# Patient Record
Sex: Male | Born: 1990 | Race: Black or African American | Hispanic: No | Marital: Single | Smoking: Never smoker
Health system: Southern US, Community
[De-identification: ages and names within clinical notes are randomized; demographics above are authoritative.]

## PROBLEM LIST (undated history)

## (undated) DIAGNOSIS — T7840XA Allergy, unspecified, initial encounter: Secondary | ICD-10-CM

## (undated) DIAGNOSIS — A630 Anogenital (venereal) warts: Secondary | ICD-10-CM

## (undated) DIAGNOSIS — K626 Ulcer of anus and rectum: Secondary | ICD-10-CM

## (undated) DIAGNOSIS — Z21 Asymptomatic human immunodeficiency virus [HIV] infection status: Secondary | ICD-10-CM

## (undated) DIAGNOSIS — E7800 Pure hypercholesterolemia, unspecified: Secondary | ICD-10-CM

## (undated) DIAGNOSIS — B20 Human immunodeficiency virus [HIV] disease: Secondary | ICD-10-CM

## (undated) HISTORY — DX: Human immunodeficiency virus (HIV) disease: B20

## (undated) HISTORY — DX: Ulcer of anus and rectum: K62.6

## (undated) HISTORY — DX: Allergy, unspecified, initial encounter: T78.40XA

## (undated) HISTORY — DX: Pure hypercholesterolemia, unspecified: E78.00

## (undated) HISTORY — DX: Anogenital (venereal) warts: A63.0

## (undated) HISTORY — DX: Asymptomatic human immunodeficiency virus (hiv) infection status: Z21

---
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
## 2010-09-24 ENCOUNTER — Emergency Department (HOSPITAL_COMMUNITY)
Admission: EM | Admit: 2010-09-24 | Discharge: 2010-09-24 | Payer: Self-pay | Source: Home / Self Care | Admitting: Family Medicine

## 2010-11-23 LAB — URINALYSIS, ROUTINE W REFLEX MICROSCOPIC
Glucose, UA: NEGATIVE mg/dL
Ketones, ur: 15 mg/dL — AB
Leukocytes, UA: NEGATIVE
Nitrite: NEGATIVE
Protein, ur: 30 mg/dL — AB
Specific Gravity, Urine: 1.035 — ABNORMAL HIGH (ref 1.005–1.030)
Urobilinogen, UA: 1 mg/dL (ref 0.0–1.0)
pH: 6 (ref 5.0–8.0)

## 2010-11-23 LAB — URINE MICROSCOPIC-ADD ON

## 2010-11-23 LAB — RAPID STREP SCREEN (MED CTR MEBANE ONLY): Streptococcus, Group A Screen (Direct): NEGATIVE

## 2010-12-01 ENCOUNTER — Ambulatory Visit (INDEPENDENT_AMBULATORY_CARE_PROVIDER_SITE_OTHER): Payer: Self-pay

## 2010-12-01 DIAGNOSIS — B20 Human immunodeficiency virus [HIV] disease: Secondary | ICD-10-CM

## 2010-12-01 DIAGNOSIS — Z21 Asymptomatic human immunodeficiency virus [HIV] infection status: Secondary | ICD-10-CM

## 2010-12-01 MED ORDER — PNEUMOCOCCAL VAC POLYVALENT 25 MCG/0.5ML IJ INJ
0.5000 mL | INJECTION | Freq: Once | INTRAMUSCULAR | Status: AC
  Administered 2010-12-01: 0.5 mL via INTRAMUSCULAR

## 2010-12-02 LAB — COMPLETE METABOLIC PANEL WITH GFR
AST: 17 U/L (ref 0–37)
Alkaline Phosphatase: 41 U/L (ref 39–117)
BUN: 13 mg/dL (ref 6–23)
Creat: 0.92 mg/dL (ref 0.40–1.50)

## 2010-12-02 LAB — CBC WITH DIFFERENTIAL/PLATELET
Basophils Absolute: 0 10*3/uL (ref 0.0–0.1)
Eosinophils Relative: 1 % (ref 0–5)
Lymphocytes Relative: 38 % (ref 12–46)
MCV: 88.1 fL (ref 78.0–100.0)
Neutrophils Relative %: 53 % (ref 43–77)
Platelets: 156 10*3/uL (ref 150–400)
RDW: 14.8 % (ref 11.5–15.5)
WBC: 5.2 10*3/uL (ref 4.0–10.5)

## 2010-12-02 LAB — LIPID PANEL
Cholesterol: 182 mg/dL (ref 0–200)
HDL: 39 mg/dL — ABNORMAL LOW (ref >39)
Total CHOL/HDL Ratio: 4.7 Ratio
Triglycerides: 100 mg/dL (ref <150)
VLDL: 20 mg/dL (ref 0–40)

## 2010-12-02 LAB — URINALYSIS
Hgb urine dipstick: NEGATIVE
Ketones, ur: NEGATIVE mg/dL
Nitrite: NEGATIVE
Specific Gravity, Urine: 1.023 (ref 1.005–1.030)
Urobilinogen, UA: 1 mg/dL (ref 0.0–1.0)

## 2010-12-02 LAB — T-HELPER CELL (CD4) - (RCID CLINIC ONLY)
CD4 % Helper T Cell: 25 % — ABNORMAL LOW (ref 33–55)
CD4 T Cell Abs: 540 uL (ref 400–2700)

## 2010-12-02 LAB — HEPATITIS A ANTIBODY, TOTAL: Hep A Total Ab: NEGATIVE

## 2010-12-02 LAB — HEPATITIS B CORE ANTIBODY, TOTAL: Hep B Core Total Ab: NEGATIVE

## 2010-12-02 LAB — HEPATITIS C ANTIBODY: HCV Ab: NEGATIVE

## 2010-12-03 LAB — HIV-1 RNA ULTRAQUANT REFLEX TO GENTYP+
HIV 1 RNA Quant: 11200 copies/mL — ABNORMAL HIGH (ref <20)
HIV-1 RNA Quant, Log: 4.05 {Log} — ABNORMAL HIGH (ref <1.30)

## 2010-12-10 LAB — HIV-1 GENOTYPR PLUS

## 2010-12-15 ENCOUNTER — Encounter: Payer: Self-pay | Admitting: Adult Health

## 2010-12-15 ENCOUNTER — Ambulatory Visit (INDEPENDENT_AMBULATORY_CARE_PROVIDER_SITE_OTHER): Payer: Self-pay | Admitting: Adult Health

## 2010-12-15 DIAGNOSIS — Z21 Asymptomatic human immunodeficiency virus [HIV] infection status: Secondary | ICD-10-CM

## 2010-12-15 DIAGNOSIS — J309 Allergic rhinitis, unspecified: Secondary | ICD-10-CM

## 2010-12-15 DIAGNOSIS — Z0000 Encounter for general adult medical examination without abnormal findings: Secondary | ICD-10-CM

## 2010-12-15 DIAGNOSIS — Z23 Encounter for immunization: Secondary | ICD-10-CM

## 2010-12-15 DIAGNOSIS — B20 Human immunodeficiency virus [HIV] disease: Secondary | ICD-10-CM

## 2010-12-15 DIAGNOSIS — J302 Other seasonal allergic rhinitis: Secondary | ICD-10-CM | POA: Insufficient documentation

## 2010-12-15 NOTE — Progress Notes (Signed)
Subjective:    Patient ID: Alexander Hancock, male    DOB: Dec 30, 1990, 20 y.o.   MRN: [id number]  HPI 20 y.o African-American male diagnosed HIV in 08/2009 in for initial evaluation and ongoing care.  He claims he has been healthy and well with no hospitalizations for illness or injury.  Denies surgical history and past health hx minimal.    Review of Systems  Constitutional: Positive for appetite change. Negative for fever, chills, diaphoresis, activity change, fatigue and unexpected weight change.       States appetite increased and had 20 pound weight gain in past 4 months  HENT: Negative for hearing loss, ear pain, nosebleeds, congestion, sore throat, facial swelling, rhinorrhea, sneezing, drooling, mouth sores, trouble swallowing, neck pain, neck stiffness, dental problem, voice change, postnasal drip, sinus pressure, tinnitus and ear discharge.   Eyes: Negative for photophobia, pain, discharge, redness, itching and visual disturbance.  Respiratory: Negative for apnea, cough, choking, chest tightness, shortness of breath, wheezing and stridor.   Cardiovascular: Negative for chest pain, palpitations and leg swelling.  Gastrointestinal: Negative for nausea, vomiting, abdominal pain, diarrhea, constipation, blood in stool, abdominal distention, anal bleeding and rectal pain.  Genitourinary: Negative.  Negative for dysuria, urgency, frequency, hematuria, flank pain, decreased urine volume, discharge, penile swelling, scrotal swelling, enuresis, difficulty urinating, genital sores, penile pain and testicular pain.  Musculoskeletal: Negative.  Negative for myalgias, back pain, joint swelling, arthralgias and gait problem.  Skin: Negative for color change, pallor, rash and wound.  Neurological: Negative.  Negative for dizziness, tremors, seizures, syncope, facial asymmetry, speech difficulty, weakness, light-headedness, numbness and headaches.  Hematological: Negative.  Negative for adenopathy.  Does not bruise/bleed easily.  Psychiatric/Behavioral: Negative.  Negative for suicidal ideas, hallucinations, behavioral problems, confusion, sleep disturbance, self-injury, dysphoric mood, decreased concentration and agitation. The patient is not nervous/anxious and is not hyperactive.        Objective:   Physical Exam  Constitutional: He is oriented to person, place, and time. He appears well-developed and well-nourished. No distress.  HENT:  Head: Normocephalic and atraumatic.  Right Ear: External ear normal.  Left Ear: External ear normal.  Nose: Nose normal.  Mouth/Throat: Oropharynx is clear and moist. No oropharyngeal exudate.  Eyes: Conjunctivae and EOM are normal. Pupils are equal, round, and reactive to light. Right eye exhibits no discharge. Left eye exhibits no discharge. No scleral icterus.  Neck: Normal range of motion. Neck supple. No JVD present. No tracheal deviation present. No thyromegaly present.  Cardiovascular: Normal rate, regular rhythm, normal heart sounds and intact distal pulses.  Exam reveals no gallop and no friction rub.   No murmur heard. Pulmonary/Chest: Effort normal and breath sounds normal. No stridor. No respiratory distress. He has no wheezes. He has no rales. He exhibits no tenderness.  Abdominal: Soft. Bowel sounds are normal. He exhibits no distension and no mass. There is no tenderness. There is no rebound and no guarding.  Genitourinary: Rectum normal and penis normal. No penile tenderness.  Musculoskeletal: Normal range of motion. He exhibits no edema and no tenderness.  Lymphadenopathy:    He has no cervical adenopathy.  Neurological: He is alert and oriented to person, place, and time. He has normal reflexes. No cranial nerve deficit. He exhibits normal muscle tone. Coordination normal.  Skin: Skin is warm and dry. No rash noted. He is not diaphoretic. No erythema. No pallor.  Psychiatric: He has a normal mood and affect. His behavior is normal.  Judgment and thought content normal.  Assessment & Plan:  20 y/o African-American male with no significant past/current medical-surgical hx except HIV diagnosed in 08/2009 with a normal physical examination.  His CD4 on 12/01/2010 was 540 @ 25% and his VL was 11,200 copies/ml.  Given his current health state and labs we recommend a referral to Research Team for eval of START or if he so desires to definitely be on therapy, perhaps 5303 Study.  We will forward our findings to Deirdre Evener, RN from Research to eval his record.  Spoke iin detail with him regarding his HIV, current health status, and our recommendations for Research referral.  He seemed amenable to this and expressed interest.  He was willing to have Mrs. Epperson contact him to set-up an initial evaluation.  Meanwhile, we will start his Hep A vaccination series today and provide him with Tetanus vaccine as he has not had one since the late 90's.   We  will defer f/u pending further work with Research team.

## 2010-12-15 NOTE — Progress Notes (Signed)
Addended by: Domenick Gong on: 12/15/2010 03:42 PM   Modules accepted: Orders

## 2010-12-21 ENCOUNTER — Encounter: Payer: Self-pay | Admitting: *Deleted

## 2010-12-21 ENCOUNTER — Ambulatory Visit (INDEPENDENT_AMBULATORY_CARE_PROVIDER_SITE_OTHER): Payer: Self-pay | Admitting: *Deleted

## 2010-12-21 VITALS — BP 132/79 | HR 85 | Temp 98.2°F | Resp 16 | Ht 71.0 in | Wt 187.2 lb

## 2010-12-21 DIAGNOSIS — B20 Human immunodeficiency virus [HIV] disease: Secondary | ICD-10-CM

## 2010-12-21 DIAGNOSIS — Z21 Asymptomatic human immunodeficiency virus [HIV] infection status: Secondary | ICD-10-CM

## 2010-12-21 LAB — COMPREHENSIVE METABOLIC PANEL
Albumin: 4.6 g/dL (ref 3.5–5.2)
Alkaline Phosphatase: 36 U/L — ABNORMAL LOW (ref 39–117)
CO2: 26 mEq/L (ref 19–32)
Glucose, Bld: 85 mg/dL (ref 70–99)
Potassium: 3.8 mEq/L (ref 3.5–5.3)
Sodium: 136 mEq/L (ref 135–145)
Total Protein: 8.9 g/dL — ABNORMAL HIGH (ref 6.0–8.3)

## 2010-12-21 LAB — POCT URINALYSIS DIPSTICK
Protein, UA: NEGATIVE
Spec Grav, UA: 1.02
Urobilinogen, UA: 0.2
pH, UA: 6

## 2010-12-21 LAB — LIPID PANEL: Total CHOL/HDL Ratio: 4.2 Ratio

## 2010-12-21 NOTE — Progress Notes (Signed)
Patient consented to the START study. The consent was reviewed with the subject and all questions answered. He denies any current problems except for itchy eyes related to allergies, which he uses visine eyedrops for. He stated understanding that he can be randomized to either starting meds right away or waiting until his CD4 drops below 350. He will return in 2 weeks for repeat CD4 and EKG.

## 2011-01-08 ENCOUNTER — Ambulatory Visit (INDEPENDENT_AMBULATORY_CARE_PROVIDER_SITE_OTHER): Payer: Self-pay | Admitting: *Deleted

## 2011-01-08 VITALS — BP 114/70 | HR 81 | Temp 98.4°F | Resp 16 | Wt 188.0 lb

## 2011-01-08 DIAGNOSIS — B20 Human immunodeficiency virus [HIV] disease: Secondary | ICD-10-CM

## 2011-01-08 NOTE — Progress Notes (Signed)
Alexander Hancock was here to have the second screening visit for START. He denies any new problems or concerns. CD4 was obtained. AN ECG was performed according to the guidelines for START. E Point was 12 and V6 was 16. Once his lab results come back we will be able to determine his eligibility for the study. I told him we will call next week and let him know if his CD4 is over 500. If it is not, we can repeat it one time. If he is eligible, we will enter him on study probably toward the end of next week.

## 2011-01-18 ENCOUNTER — Encounter: Payer: Self-pay | Admitting: *Deleted

## 2011-01-18 NOTE — Progress Notes (Unsigned)
Alexander Hancock entered the START study today. He was randomized to early treatment. I will have in schedule and appt to speak with [facility] about treatment and get him started on meds soon.

## 2011-01-21 ENCOUNTER — Ambulatory Visit: Payer: Self-pay | Admitting: Adult Health

## 2011-01-23 ENCOUNTER — Inpatient Hospital Stay (INDEPENDENT_AMBULATORY_CARE_PROVIDER_SITE_OTHER)
Admission: RE | Admit: 2011-01-23 | Discharge: 2011-01-23 | Disposition: A | Discharge status: Home / Self Care | Payer: Self-pay | Source: Ambulatory Visit | Attending: Family Medicine | Admitting: Family Medicine

## 2011-01-23 DIAGNOSIS — R6889 Other general symptoms and signs: Secondary | ICD-10-CM

## 2011-01-25 ENCOUNTER — Encounter: Payer: Self-pay | Admitting: Adult Health

## 2011-01-25 LAB — CD4/CD8 (T-HELPER/T-SUPPRESSOR CELL)
CD4%: 26.4
CD4: 520
CD4: 528
CD8 % Suppressor T Cell: 61
CD8: 1220

## 2011-01-27 ENCOUNTER — Ambulatory Visit (INDEPENDENT_AMBULATORY_CARE_PROVIDER_SITE_OTHER): Payer: Self-pay | Admitting: Adult Health

## 2011-01-27 ENCOUNTER — Encounter: Payer: Self-pay | Admitting: Adult Health

## 2011-01-27 VITALS — BP 129/77 | HR 80 | Temp 98.3°F | Ht 71.0 in | Wt 186.0 lb

## 2011-01-27 DIAGNOSIS — Z21 Asymptomatic human immunodeficiency virus [HIV] infection status: Secondary | ICD-10-CM

## 2011-01-27 DIAGNOSIS — B20 Human immunodeficiency virus [HIV] disease: Secondary | ICD-10-CM

## 2011-01-27 MED ORDER — EMTRICITABINE-TENOFOVIR DF 200-300 MG PO TABS: 1.0000 | ORAL_TABLET | Freq: Every day | ORAL | Status: DC

## 2011-01-27 MED ORDER — RALTEGRAVIR POTASSIUM 400 MG PO TABS: 400.0000 mg | ORAL_TABLET | Freq: Two times a day (BID) | ORAL | Status: DC

## 2011-01-27 NOTE — Progress Notes (Signed)
  Subjective:    Patient ID: Alexander Hancock, male    DOB: Jan 21, 1991, 20 y.o.   MRN: [id number]  HPI Presented to clinic today for review of a possible. Treatment regimen for HIV as he was randomized to treatment in the START study. Voices no complaints and states has been helping well.   Review of Systems     Objective:   Physical Exam  Constitutional: He is oriented to person, place, and time. He appears well-developed and well-nourished.  HENT:  Head: Normocephalic and atraumatic.  Eyes: Conjunctivae and EOM are normal. Pupils are equal, round, and reactive to light.  Neck: Normal range of motion. Neck supple.  Cardiovascular: Normal rate and regular rhythm.   Pulmonary/Chest: Effort normal and breath sounds normal.  Abdominal: Soft. Bowel sounds are normal.  Musculoskeletal: Normal range of motion.  Neurological: He is oriented to person, place, and time. No cranial nerve deficit.  Skin: Skin is warm and dry.  Psychiatric: He has a normal mood and affect. His behavior is normal. Judgment and thought content normal.          Assessment & Plan:  1. HIV. After discussing multiple regimens for his HIV therapy, including discussion of various sites of drug, activity, he arrived at a decision to begin Isentress and Truvada. Drug effects, side effects, regimen restrictions, potential adverse drug reactions, and toxicities were discussed in detail. He verbally acknowledged understanding of all information that was provided, and agreed with plan of care. We are planning routine research labs 4 weeks after beginning treatment, and a followup with me 6 weeks after beginning treatment. Prescriptions were written and dispensed to the research team.

## 2011-01-29 ENCOUNTER — Other Ambulatory Visit: Payer: Self-pay | Admitting: *Deleted

## 2011-01-29 DIAGNOSIS — B20 Human immunodeficiency virus [HIV] disease: Secondary | ICD-10-CM

## 2011-01-29 MED ORDER — EMTRICITABINE-TENOFOVIR DF 200-300 MG PO TABS: 1.0000 | ORAL_TABLET | Freq: Every day | ORAL | Status: DC

## 2011-01-29 MED ORDER — RALTEGRAVIR POTASSIUM 400 MG PO TABS: 400.0000 mg | ORAL_TABLET | Freq: Two times a day (BID) | ORAL | Status: DC

## 2011-02-02 ENCOUNTER — Ambulatory Visit: Payer: Self-pay

## 2011-02-03 ENCOUNTER — Ambulatory Visit (INDEPENDENT_AMBULATORY_CARE_PROVIDER_SITE_OTHER): Payer: Self-pay | Admitting: *Deleted

## 2011-02-03 DIAGNOSIS — B20 Human immunodeficiency virus [HIV] disease: Secondary | ICD-10-CM

## 2011-02-03 NOTE — Progress Notes (Signed)
Patient came in today to pick up his new meds from the pharmacist. He was instructed on dosing, side effects, when to call for problems. He was also counseled on adherence and a pill box was given to him. He will return in 1 month for followup.

## 2011-03-04 ENCOUNTER — Ambulatory Visit (INDEPENDENT_AMBULATORY_CARE_PROVIDER_SITE_OTHER): Payer: Self-pay | Admitting: *Deleted

## 2011-03-04 VITALS — BP 115/72 | HR 92 | Temp 98.1°F | Resp 16 | Wt 191.2 lb

## 2011-03-04 DIAGNOSIS — B20 Human immunodeficiency virus [HIV] disease: Secondary | ICD-10-CM

## 2011-03-04 DIAGNOSIS — Z21 Asymptomatic human immunodeficiency virus [HIV] infection status: Secondary | ICD-10-CM

## 2011-03-04 LAB — POCT URINALYSIS DIPSTICK
Leukocytes, UA: NEGATIVE
Protein, UA: NEGATIVE
Urobilinogen, UA: 1
pH, UA: 7

## 2011-03-04 LAB — BASIC METABOLIC PANEL
CO2: 30 mEq/L (ref 19–32)
Calcium: 9.6 mg/dL (ref 8.4–10.5)
Sodium: 140 mEq/L (ref 135–145)

## 2011-03-04 NOTE — Progress Notes (Signed)
Patient is here for his 1 month START study followup. He denies any problems with his meds since he started them a month ago. His adherence has been excellent, even to the second dose of isentress. He will return in 3 months for his next visit.

## 2011-03-05 LAB — HIV-1 RNA QUANT-NO REFLEX-BLD
HIV 1 RNA Quant: <20 copies/mL (ref <20)
HIV-1 RNA Quant, Log: <1.3 {Log} (ref <1.30)

## 2011-03-11 ENCOUNTER — Encounter: Payer: Self-pay | Admitting: Adult Health

## 2011-03-11 LAB — CD4/CD8 (T-HELPER/T-SUPPRESSOR CELL): CD4: 659

## 2011-07-13 ENCOUNTER — Encounter: Payer: Self-pay | Admitting: *Deleted

## 2011-07-13 NOTE — Progress Notes (Signed)
Patient has missed his month 4 start study visit. We attempted to call all of his phone numbers and contacts and all had been disconnected. Also, tried to email him with no reply.  He last saw the provider in May. Will do [facility] referral to see if they can connect with him.

## 2011-07-16 ENCOUNTER — Ambulatory Visit (INDEPENDENT_AMBULATORY_CARE_PROVIDER_SITE_OTHER): Payer: Self-pay | Admitting: *Deleted

## 2011-07-16 VITALS — BP 118/73 | HR 75 | Temp 98.2°F | Resp 15 | Wt 201.5 lb

## 2011-07-16 DIAGNOSIS — Z23 Encounter for immunization: Secondary | ICD-10-CM

## 2011-07-16 DIAGNOSIS — Z21 Asymptomatic human immunodeficiency virus [HIV] infection status: Secondary | ICD-10-CM

## 2011-07-16 DIAGNOSIS — Z299 Encounter for prophylactic measures, unspecified: Secondary | ICD-10-CM

## 2011-07-16 DIAGNOSIS — B20 Human immunodeficiency virus [HIV] disease: Secondary | ICD-10-CM

## 2011-07-16 LAB — BASIC METABOLIC PANEL
CO2: 28 mEq/L (ref 19–32)
Calcium: 9.7 mg/dL (ref 8.4–10.5)
Chloride: 101 mEq/L (ref 96–112)
Creat: 0.91 mg/dL (ref 0.50–1.35)
Glucose, Bld: 89 mg/dL (ref 70–99)

## 2011-07-16 LAB — POCT URINALYSIS DIPSTICK
Bilirubin, UA: NEGATIVE
Ketones, UA: NEGATIVE
Protein, UA: NEGATIVE
Spec Grav, UA: 1.02
pH, UA: 7.5

## 2011-07-16 NOTE — Progress Notes (Signed)
Patient here for his 4 month START study visit. He had run out of meds 3 days ago and had forgot about his appt. Earlier. He does not have a phone and uses email for most contacts. He denies any problems currently. I instructed him to make sure his Juanell Fairly gets renewed and he sees brad after the first of the year.

## 2011-07-22 ENCOUNTER — Ambulatory Visit: Payer: Self-pay

## 2011-07-26 ENCOUNTER — Encounter: Payer: Self-pay | Admitting: Adult Health

## 2011-07-26 LAB — CD4/CD8 (T-HELPER/T-SUPPRESSOR CELL)
CD4: 741
CD8 % Suppressor T Cell: 51.6
CD8: 1084

## 2011-10-22 ENCOUNTER — Ambulatory Visit (INDEPENDENT_AMBULATORY_CARE_PROVIDER_SITE_OTHER): Payer: Self-pay | Admitting: *Deleted

## 2011-10-22 VITALS — BP 116/79 | HR 73 | Temp 98.7°F | Resp 16 | Wt 200.2 lb

## 2011-10-22 DIAGNOSIS — B20 Human immunodeficiency virus [HIV] disease: Secondary | ICD-10-CM

## 2011-10-22 LAB — POCT URINALYSIS DIPSTICK
Ketones, UA: NEGATIVE
Protein, UA: NEGATIVE
Spec Grav, UA: 1.025
Urobilinogen, UA: 0.2
pH, UA: 6.5

## 2011-10-22 LAB — BASIC METABOLIC PANEL
BUN: 17 mg/dL (ref 6–23)
CO2: 26 mEq/L (ref 19–32)
Calcium: 9.8 mg/dL (ref 8.4–10.5)
Creat: 0.86 mg/dL (ref 0.50–1.35)
Glucose, Bld: 88 mg/dL (ref 70–99)
Sodium: 138 mEq/L (ref 135–145)

## 2011-10-22 NOTE — Progress Notes (Signed)
Patient here for his 8 month START study visit. He says he has been very adherent with his meds. He denies any problems or  Illnesses since his last visit with [place]. He was instructed to schedule a visit with Nida Boatman since it had been a while since he was seen last.

## 2011-10-26 LAB — HIV-1 RNA QUANT-NO REFLEX-BLD: HIV 1 RNA Quant: <20 copies/mL (ref <20)

## 2011-11-08 ENCOUNTER — Ambulatory Visit: Payer: Self-pay | Admitting: Adult Health

## 2011-11-10 ENCOUNTER — Emergency Department (HOSPITAL_COMMUNITY)
Admission: EM | Admit: 2011-11-10 | Discharge: 2011-11-10 | Disposition: A | Discharge status: Home Health | Payer: Self-pay | Attending: Emergency Medicine | Admitting: Emergency Medicine

## 2011-11-10 ENCOUNTER — Encounter (HOSPITAL_COMMUNITY): Payer: Self-pay | Admitting: *Deleted

## 2011-11-10 DIAGNOSIS — R61 Generalized hyperhidrosis: Secondary | ICD-10-CM | POA: Insufficient documentation

## 2011-11-10 DIAGNOSIS — R197 Diarrhea, unspecified: Secondary | ICD-10-CM | POA: Insufficient documentation

## 2011-11-10 DIAGNOSIS — Z21 Asymptomatic human immunodeficiency virus [HIV] infection status: Secondary | ICD-10-CM | POA: Insufficient documentation

## 2011-11-10 DIAGNOSIS — R110 Nausea: Secondary | ICD-10-CM | POA: Insufficient documentation

## 2011-11-10 DIAGNOSIS — R509 Fever, unspecified: Secondary | ICD-10-CM | POA: Insufficient documentation

## 2011-11-10 DIAGNOSIS — R1033 Periumbilical pain: Secondary | ICD-10-CM | POA: Insufficient documentation

## 2011-11-10 DIAGNOSIS — R1084 Generalized abdominal pain: Secondary | ICD-10-CM

## 2011-11-10 LAB — COMPREHENSIVE METABOLIC PANEL
Alkaline Phosphatase: 45 U/L (ref 39–117)
BUN: 12 mg/dL (ref 6–23)
Calcium: 9.5 mg/dL (ref 8.4–10.5)
GFR calc Af Amer: >90 mL/min (ref >90)
Glucose, Bld: 116 mg/dL — ABNORMAL HIGH (ref 70–99)
Potassium: 3.5 mEq/L (ref 3.5–5.1)
Total Protein: 8.1 g/dL (ref 6.0–8.3)

## 2011-11-10 LAB — CBC
Hemoglobin: 15.3 g/dL (ref 13.0–17.0)
MCH: 30.5 pg (ref 26.0–34.0)
MCV: 87 fL (ref 78.0–100.0)
RBC: 5.01 MIL/uL (ref 4.22–5.81)

## 2011-11-10 LAB — DIFFERENTIAL
Eosinophils Absolute: 0 10*3/uL (ref 0.0–0.7)
Eosinophils Relative: 0 % (ref 0–5)
Lymphs Abs: 1.2 10*3/uL (ref 0.7–4.0)
Monocytes Relative: 9 % (ref 3–12)

## 2011-11-10 MED ORDER — SODIUM CHLORIDE 0.9 % IV BOLUS (SEPSIS)
1000.0000 mL | Freq: Once | INTRAVENOUS | Status: AC
  Administered 2011-11-10: 1000 mL via INTRAVENOUS

## 2011-11-10 MED ORDER — HYDROCODONE-ACETAMINOPHEN 5-325 MG PO TABS: 1.0000 | ORAL_TABLET | Freq: Four times a day (QID) | ORAL | Status: AC | PRN

## 2011-11-10 MED ORDER — ONDANSETRON HCL 4 MG/2ML IJ SOLN
4.0000 mg | INTRAMUSCULAR | Status: DC | PRN
  Administered 2011-11-10: 4 mg via INTRAVENOUS
  Filled 2011-11-10: qty 2

## 2011-11-10 MED ORDER — MORPHINE SULFATE 4 MG/ML IJ SOLN
4.0000 mg | Freq: Once | INTRAMUSCULAR | Status: AC
  Administered 2011-11-10: 4 mg via INTRAVENOUS
  Filled 2011-11-10: qty 1

## 2011-11-10 NOTE — ED Notes (Signed)
Pt states 'I think I have a stomach virus".  Pt has had abdominal pain in his mid abdominal area with n/v and diarrhea.  This began yesterday.  Pt states that he has been able to hold fluids down but continues to have diarrhea.  No fever or chills at this time.

## 2011-11-10 NOTE — ED Notes (Signed)
PT HAS WAITED 3 HOURS. HE IS ALERT AND ORIENTED. GAIT IS STEADY. DENIES ANY DIZZINESS. PA AWARE AND WILL ALLOW PT TO GO HOME NOW

## 2011-11-10 NOTE — ED Notes (Signed)
PATIENT HAS RECEIVED HIS DISCHARGE PAPERS. HE INSISTS THERE IS NO ONE THAT CAN DRIVE HIM HOME. WILL HOLD PT UNTIL SAFE TO DRIVE HOME.

## 2011-11-10 NOTE — ED Notes (Signed)
Noticed pt had morphine at 1030. Pt states he is to drive self home. Stated to pt he needed a ride and pt stated he had no one to call. Notified pt nurse, Donnamae Jude, that pt had no ride home and could not drive currently due to medication administration.

## 2011-11-10 NOTE — ED Notes (Signed)
Pt has low grade temp at this time and noted that he had a fever 2 nights ago (unsure of how high it was)

## 2011-11-10 NOTE — ED Provider Notes (Signed)
History     CSN: [id number]  Arrival date & time 11/10/11  0904   First MD Initiated Contact with Patient 11/10/11 [phone]      Chief Complaint  Patient presents with  . Abdominal Pain    (Consider location/radiation/quality/duration/timing/severity/associated sxs/prior treatment) HPI Comments: Patient with history of HIV infection presents emergency department with chief complaint of mild abdominal pain, diarrhea, and low-grade fevers that began yesterday.  Patient's last CD4 count was 1 mo ago, at 784  Pt is compliant with HIV medication.   Patient is a 21 y.o. male presenting with abdominal pain and diarrhea. The history is provided by the patient.  Abdominal Pain The primary symptoms of the illness include abdominal pain, fever, nausea and diarrhea. The primary symptoms of the illness do not include fatigue, shortness of breath, vomiting, hematemesis, hematochezia or dysuria. The current episode started yesterday. The onset of the illness was sudden. The problem has not changed since onset. The abdominal pain is located in the periumbilical region. The abdominal pain does not radiate. The severity of the abdominal pain is 7/10. The abdominal pain is relieved by being still (sitting up ). The abdominal pain is exacerbated by certain positions.  The fever began yesterday. The maximum temperature recorded prior to his arrival was 100 to 100.9 F. The temperature was taken by an oral thermometer.  The patient has had a change in bowel habit. Additional symptoms associated with the illness include diaphoresis. Symptoms associated with the illness do not include chills, anorexia, heartburn, constipation, urgency, hematuria, frequency or back pain. Significant associated medical issues include HIV. Significant associated medical issues do not include PUD, GERD, inflammatory bowel disease, diabetes, sickle cell disease, gallstones, liver disease, substance abuse, diverticulitis or cardiac disease.    Diarrhea The primary symptoms include fever, abdominal pain, nausea and diarrhea. Primary symptoms do not include weight loss, fatigue, vomiting, melena, hematemesis, jaundice, hematochezia, dysuria, myalgias, arthralgias or rash. The illness began yesterday. The onset was sudden (episode about every 20 min. Watery ). The problem has not changed since onset. The illness does not include chills, anorexia, dysphagia, odynophagia, bloating, constipation, tenesmus, back pain or itching. Associated medical issues do not include inflammatory bowel disease, GERD, gallstones, liver disease, alcohol abuse, PUD, gastric bypass, bowel resection, irritable bowel syndrome, hemorrhoids or diverticulitis.    Past Medical History  Diagnosis Date  . Allergy   . HIV infection     History reviewed. No pertinent past surgical history.  Family History  Problem Relation Age of Onset  . Multiple sclerosis Mother     History  Substance Use Topics  . Smoking status: Never Smoker   . Smokeless tobacco: Never Used  . Alcohol Use: No      Review of Systems  Constitutional: Positive for fever and diaphoresis. Negative for chills, weight loss and fatigue.  HENT: Positive for sore throat. Negative for congestion, rhinorrhea, mouth sores, trouble swallowing, neck pain and neck stiffness.   Respiratory: Negative for shortness of breath.   Cardiovascular: Negative for chest pain, palpitations and leg swelling.  Gastrointestinal: Positive for nausea, abdominal pain and diarrhea. Negative for heartburn, dysphagia, vomiting, constipation, melena, hematochezia, bloating, anorexia, hematemesis and jaundice.  Genitourinary: Negative for dysuria, urgency, frequency and hematuria.  Musculoskeletal: Negative for myalgias, back pain and arthralgias.  Skin: Negative for itching and rash.  Neurological: Negative for dizziness and light-headedness.  All other systems reviewed and are negative.    Allergies  Review of  patient's allergies indicates no known allergies.  Home Medications   Current Outpatient Rx  Name Route Sig Dispense Refill  . EMTRICITABINE-TENOFOVIR 200-300 MG PO TABS Oral Take 1 tablet by mouth daily. 30 tablet 11  . RALTEGRAVIR POTASSIUM 400 MG PO TABS Oral Take 1 tablet (400 mg total) by mouth 2 (two) times daily. 60 tablet 11    BP 138/72  Pulse 104  Temp(Src) 100.4 F (38 C) (Oral)  Resp 16  SpO2 98%  Physical Exam  Nursing note and vitals reviewed. Constitutional: He is oriented to person, place, and time. Vital signs are normal. He appears well-developed and well-nourished. No distress.  HENT:  Head: Normocephalic and atraumatic. No trismus in the jaw.  Right Ear: Tympanic membrane, external ear and ear canal normal.  Left Ear: Tympanic membrane, external ear and ear canal normal.  Nose: Nose normal. No rhinorrhea. Right sinus exhibits no maxillary sinus tenderness and no frontal sinus tenderness. Left sinus exhibits no maxillary sinus tenderness and no frontal sinus tenderness.  Mouth/Throat: Uvula is midline, oropharynx is clear and moist and mucous membranes are normal. Normal dentition. No dental abscesses or uvula swelling. No oropharyngeal exudate, posterior oropharyngeal erythema or tonsillar abscesses.       No submental edema, tongue not elevated, no trismus. No impending airway obstruction; Pt able to speak full sentences, swallow intact, no drooling, stridor, or tonsillar/uvula displacement. No palatal petechia  Eyes: Conjunctivae and EOM are normal. Pupils are equal, round, and reactive to light.  Neck: Trachea normal, normal range of motion and full passive range of motion without pain. Neck supple. No spinous process tenderness and no muscular tenderness present. No rigidity. Erythema present. Normal range of motion present. No Brudzinski's sign noted.       Flexion and extension of neck without pain or difficulty. Able to breath without difficulty in extension.    Cardiovascular: Normal rate, regular rhythm and normal heart sounds.        Tachycardic  Pulmonary/Chest: Effort normal and breath sounds normal. No accessory muscle usage or stridor. Not tachypneic. No respiratory distress. He has no wheezes.  Abdominal: Soft. Normal appearance. He exhibits no distension, no ascites, no pulsatile midline mass and no mass. There is tenderness. There is no CVA tenderness. No hernia.         Abdomen soft, non distended. Mild diffuse generalized ttp.Nowel sounds hyperactive. No obvious evidence of splenomegaly.   Musculoskeletal: Normal range of motion.  Lymphadenopathy:       Head (right side): No preauricular and no posterior auricular adenopathy present.       Head (left side): No preauricular and no posterior auricular adenopathy present.    He has no cervical adenopathy.  Neurological: He is alert and oriented to person, place, and time.  Skin: Skin is warm and dry. No rash noted. He is not diaphoretic.  Psychiatric: He has a normal mood and affect. His speech is normal and behavior is normal.    ED Course  Procedures (including critical care time)  Labs Reviewed  CBC - Abnormal; Notable for the following:    Platelets 125 (*)    All other components within normal limits  DIFFERENTIAL  COMPREHENSIVE METABOLIC PANEL   No results found.   No diagnosis found.    MDM  Diarrhea   Vitals are stable, no fever or white count.   No signs of dehydration, tolerating PO fluids > 6 oz.  Lungs are clear.  No focal abdominal pain, no concern for appendicitis, cholecystitis, pancreatitis, ruptured viscus, UTI, kidney  stone, or any other abdominal etiology.  Supportive therapy indicated with return if symptoms worsen.  Patient counseled and advised to f-u w PCP         Jaci Carrel, PA-C 11/10/11 1111

## 2011-11-10 NOTE — Discharge Instructions (Signed)
Follow up with the Gastroenterologist or general surgeon listed above for further evaluation of your abdominal pain. Only use your pain medication for severe pain. Do not operate heavy machinery while on pain medication or muscle relaxer. Note that your pain medication contains acetaminophen (Tylenol) & its is not reccommended that you use additional acetaminophen (Tylenol) while taking this medication.   Abdominal Pain  Your exam might not show the exact reason you have abdominal pain. Since there are many different causes of abdominal pain, another checkup and more tests may be needed. It is very important to follow up for lasting (persistent) or worsening symptoms. A possible cause of abdominal pain in any person who still has his or her appendix is acute appendicitis. Appendicitis is often hard to diagnose. Normal blood tests, urine tests, ultrasound, and CT scans do not completely rule out early appendicitis or other causes of abdominal pain. Sometimes, only the changes that happen over time will allow appendicitis and other causes of abdominal pain to be determined. Other potential problems that may require surgery may also take time to become more apparent. Because of this, it is important that you follow all of the instructions below.   HOME CARE INSTRUCTIONS  Do not take laxatives unless directed by your caregiver. Rest as much as possible.  Do not eat solid food until your pain is gone: A diet of water, weak decaffeinated tea, broth or bouillon, gelatin, oral rehydration solutions (ORS), frozen ice pops, or ice chips may be helpful.  When pain is gone: Start a light diet (dry toast, crackers, applesauce, or white rice). Increase the diet slowly as long as it does not bother you. Eat no dairy products (including cheese and eggs) and no spicy, fatty, fried, or high-fiber foods.  Use no alcohol, caffeine, or cigarettes.  Take your regular medicines unless your caregiver told you not to.  Take any  prescribed medicine as directed.   SEEK IMMEDIATE MEDICAL CARE IF:  The pain does not go away.  You have a fever >101 that persists You keep throwing up (vomiting) or cannot drink liquids.  The pain becomes localized (Pain in the right side could possibly be appendicitis. In an adult, pain in the left lower portion of the abdomen could be colitis or diverticulitis). You pass bloody or black tarry stools.  You have shaking chills.  There is blood in your vomit or you see blood in your bowel movements.  Your bowel movements stop (become blocked) or you cannot pass gas.  You have bloody, frequent, or painful urination.  You have yellow discoloration in the skin or whites of the eyes.  Your stomach becomes bloated or bigger.  You have dizziness or fainting.  You have chest or back pain.     

## 2011-11-11 ENCOUNTER — Ambulatory Visit: Payer: Self-pay | Admitting: Internal Medicine

## 2011-11-11 ENCOUNTER — Telehealth: Payer: Self-pay | Admitting: *Deleted

## 2011-11-11 NOTE — ED Provider Notes (Signed)
History/physical exam/procedure(s) were performed by non-physician practitioner and as supervising physician I was immediately available for consultation/collaboration. I have reviewed all notes and am in agreement with care and plan.   Hilario Quarry, MD 11/11/11 [phone]

## 2011-11-11 NOTE — Telephone Encounter (Signed)
Called patient to advise of his missed appointment today and he advised that he was in the ED on yesterday for diarrhea and stomach pain. He said that he was resting because he does not feel well and will call us to reschedule when he does. Encouraged him to call us back asap so he can get in for a follow up for the diarrhea and to call us if it gets worse.

## 2011-12-02 ENCOUNTER — Telehealth: Payer: Self-pay

## 2011-12-02 NOTE — Telephone Encounter (Signed)
Tammy from [facility] said this patient told her Selena Batten and I said he was not eligible for RW or [facility] as he was on study - told Tammy he was not eligible for ADAP, if on study, but that has nothing to do with RW or [facility] and to have him call for appt - he was to come in for RW, but, no showed a few times since May 2012.

## 2011-12-30 ENCOUNTER — Ambulatory Visit: Payer: Self-pay

## 2012-01-20 ENCOUNTER — Ambulatory Visit: Payer: Self-pay | Admitting: Internal Medicine

## 2012-02-01 ENCOUNTER — Encounter: Payer: Self-pay | Admitting: *Deleted

## 2012-02-01 ENCOUNTER — Ambulatory Visit (INDEPENDENT_AMBULATORY_CARE_PROVIDER_SITE_OTHER): Payer: Self-pay | Admitting: Internal Medicine

## 2012-02-01 ENCOUNTER — Encounter: Payer: Self-pay | Admitting: Internal Medicine

## 2012-02-01 VITALS — BP 145/78 | HR 89 | Temp 97.5°F | Wt 205.0 lb

## 2012-02-01 DIAGNOSIS — B20 Human immunodeficiency virus [HIV] disease: Secondary | ICD-10-CM

## 2012-02-01 DIAGNOSIS — Z21 Asymptomatic human immunodeficiency virus [HIV] infection status: Secondary | ICD-10-CM

## 2012-02-16 ENCOUNTER — Ambulatory Visit (INDEPENDENT_AMBULATORY_CARE_PROVIDER_SITE_OTHER): Payer: Self-pay | Admitting: *Deleted

## 2012-02-16 VITALS — BP 111/76 | HR 73 | Temp 98.4°F | Resp 16 | Ht 71.0 in | Wt 205.5 lb

## 2012-02-16 DIAGNOSIS — Z21 Asymptomatic human immunodeficiency virus [HIV] infection status: Secondary | ICD-10-CM

## 2012-02-16 DIAGNOSIS — B20 Human immunodeficiency virus [HIV] disease: Secondary | ICD-10-CM

## 2012-02-16 LAB — POCT URINALYSIS DIPSTICK
Glucose, UA: NEGATIVE
Ketones, UA: NEGATIVE
Spec Grav, UA: >=1.03

## 2012-02-16 NOTE — Progress Notes (Signed)
Pt here for START study, 12 month. Assessment unchanged since last study visit. Pt had Abd pain and was seen and treated in ED 11/2011. Pt has not missed any ARV pills since last study visit. Pt is tolerating medications well. Fasting labs were drawn and vital signs remain stable. EKG performed; EPoint = 160, V6 = 120; NSR. ARV medications were dispensed. Pt received $20.00 gift card for study visit. Next appointment scheduled for Tuesday, 05/30/2012 @ 11am. Tacey Heap RN

## 2012-02-17 LAB — COMPREHENSIVE METABOLIC PANEL
AST: 14 U/L (ref 0–37)
Alkaline Phosphatase: 43 U/L (ref 39–117)
BUN: 22 mg/dL (ref 6–23)
Glucose, Bld: 87 mg/dL (ref 70–99)
Potassium: 4.1 mEq/L (ref 3.5–5.3)
Sodium: 137 mEq/L (ref 135–145)
Total Bilirubin: 0.9 mg/dL (ref 0.3–1.2)

## 2012-02-17 LAB — LIPID PANEL
HDL: 45 mg/dL (ref >39)
Total CHOL/HDL Ratio: 3.9 Ratio
Triglycerides: 110 mg/dL (ref <150)
VLDL: 22 mg/dL (ref 0–40)

## 2012-02-18 LAB — HIV-1 RNA QUANT-NO REFLEX-BLD: HIV 1 RNA Quant: <20 copies/mL (ref <20)

## 2012-03-02 ENCOUNTER — Encounter: Payer: Self-pay | Admitting: Internal Medicine

## 2012-03-02 LAB — CD4/CD8 (T-HELPER/T-SUPPRESSOR CELL)
CD4%: 40.6
CD4: 853
CD8 % Suppressor T Cell: 43.6
CD8: 916

## 2012-03-03 ENCOUNTER — Encounter: Payer: Self-pay | Admitting: Internal Medicine

## 2012-03-03 LAB — CD4/CD8 (T-HELPER/T-SUPPRESSOR CELL)
CD4%: 36.8
CD4: 736

## 2012-03-10 ENCOUNTER — Encounter: Payer: Self-pay | Admitting: Internal Medicine

## 2012-03-21 ENCOUNTER — Telehealth: Payer: Self-pay | Admitting: *Deleted

## 2012-03-21 NOTE — Telephone Encounter (Signed)
Patient called c/o itchy rash.  He has not started any new medications. Requested appt, scheduled with Dr. Drue Second for today. Wendall Mola CMA

## 2012-03-22 ENCOUNTER — Ambulatory Visit (INDEPENDENT_AMBULATORY_CARE_PROVIDER_SITE_OTHER): Payer: Self-pay | Admitting: Internal Medicine

## 2012-03-22 ENCOUNTER — Encounter: Payer: Self-pay | Admitting: Internal Medicine

## 2012-03-22 VITALS — BP 126/77 | HR 101 | Temp 98.2°F | Wt 207.0 lb

## 2012-03-22 DIAGNOSIS — R21 Rash and other nonspecific skin eruption: Secondary | ICD-10-CM

## 2012-03-22 DIAGNOSIS — Z2089 Contact with and (suspected) exposure to other communicable diseases: Secondary | ICD-10-CM

## 2012-03-22 MED ORDER — PERMETHRIN 5 % EX CREA: TOPICAL_CREAM | Freq: Once | CUTANEOUS | Status: AC

## 2012-04-03 DIAGNOSIS — R21 Rash and other nonspecific skin eruption: Secondary | ICD-10-CM | POA: Insufficient documentation

## 2012-04-03 NOTE — Progress Notes (Signed)
HIV CLINIC  RFV: rash  Subjective:    Patient ID: Alexander Hancock, male    DOB: 1991/01/29, 21 y.o.   MRN: [id number]  HPI Kendle is a 21yo Male with HIV, CD 4 count of 736 , with VL < 20, on raltegravir and truvada. Doing well with taking ARt medication. He reports having new rash < 7 days. Intensely itchy. He was previously treated for scabies last year and states it feels very similar. Rash involves arms mainly but also has some lesions on torso nad back. Does not involve palms or soles. No unprotected sex of late. Current Outpatient Prescriptions on File Prior to Visit  Medication Sig Dispense Refill  . emtricitabine-tenofovir (TRUVADA) 200-300 MG per tablet Take 1 tablet by mouth daily.      . raltegravir (ISENTRESS) 400 MG tablet Take 400 mg by mouth 2 (two) times daily.       Active Ambulatory Problems    Diagnosis Date Noted  . HIV (human immunodeficiency virus infection) 12/15/2010  . Seasonal allergies 12/15/2010   Resolved Ambulatory Problems    Diagnosis Date Noted  . No Resolved Ambulatory Problems   Past Medical History  Diagnosis Date  . Allergy   . HIV infection     Review of Systems  Constitutional: Negative for fever, chills, diaphoresis, activity change, appetite change, fatigue and unexpected weight change.  HENT: Negative for congestion, sore throat, rhinorrhea, sneezing, trouble swallowing and sinus pressure.  Eyes: Negative for photophobia and visual disturbance.  Respiratory: Negative for cough, chest tightness, shortness of breath, wheezing and stridor.  Cardiovascular: Negative for chest pain, palpitations and leg swelling.  Gastrointestinal: Negative for nausea, vomiting, abdominal pain, diarrhea, constipation, blood in stool, abdominal distention and anal bleeding.  Genitourinary: Negative for dysuria, hematuria, flank pain and difficulty urinating.  Musculoskeletal: Negative for myalgias, back pain, joint swelling, arthralgias and gait problem.    Skin: per hpi Neurological: Negative for dizziness, tremors, weakness and light-headedness.  Hematological: Negative for adenopathy. Does not bruise/bleed easily.  Psychiatric/Behavioral: Negative for behavioral problems, confusion, sleep disturbance, dysphoric mood, decreased concentration and agitation.       Objective:   Physical Exam BP 126/77  Pulse 101  Temp 98.2 F (36.8 C) (Oral)  Wt 207 lb (93.895 kg) Physical Exam  Constitutional: He is oriented to person, place, and time. He appears well-developed and well-nourished. No distress.  HENT:  Mouth/Throat: Oropharynx is clear and moist. No oropharyngeal exudate.  Cardiovascular: Normal rate, regular rhythm and normal heart sounds. Exam reveals no gallop and no friction rub.  No murmur heard.  Pulmonary/Chest: Effort normal and breath sounds normal. No respiratory distress. He has no wheezes.  Abdominal: Soft. Bowel sounds are normal. He exhibits no distension. There is no tenderness.  Lymphadenopathy:  He has no cervical adenopathy.  Neurological: He is alert and oriented to person, place, and time.  Skin: patches of macular papular rash on arms and dorsum of hands, between digit. A few scattered lesions on torso and back, spares palms, face, and less on legs Psychiatric: He has a normal mood and affect. His behavior is normal.       Assessment & Plan:  Presumed scabies = will do a trial of premethrin cream 5% apply from head to toe leave on for 8 hrs overnight and washoff. Can take benadryl for itching. May need repeat tx in 2 wks. Wash bedding with hot water. May need to replace mattress if old.

## 2012-05-03 ENCOUNTER — Other Ambulatory Visit: Payer: Self-pay | Admitting: Internal Medicine

## 2012-05-03 DIAGNOSIS — Z79899 Other long term (current) drug therapy: Secondary | ICD-10-CM

## 2012-05-03 DIAGNOSIS — Z113 Encounter for screening for infections with a predominantly sexual mode of transmission: Secondary | ICD-10-CM

## 2012-05-03 DIAGNOSIS — B20 Human immunodeficiency virus [HIV] disease: Secondary | ICD-10-CM

## 2012-05-16 ENCOUNTER — Other Ambulatory Visit: Payer: Self-pay

## 2012-05-30 ENCOUNTER — Ambulatory Visit: Payer: Self-pay

## 2012-05-30 ENCOUNTER — Telehealth: Payer: Self-pay | Admitting: *Deleted

## 2012-05-30 ENCOUNTER — Ambulatory Visit: Payer: Self-pay | Admitting: Internal Medicine

## 2012-05-30 NOTE — Telephone Encounter (Signed)
Called patient to try and reschedule his missed appt and his number is not in service.

## 2012-06-05 NOTE — Progress Notes (Signed)
HIV CLINIC NOTE   RFV: routine Subjective:    Patient ID: Alexander Hancock, male    DOB: [birth year], 21 y.o.   MRN: [id number]  HPI 853/ VL <20, in Feb 2013, on truvada nad raltegravir. Part of START trial. Doing well with adherence. He reports occ. Nasal congestion which he thinks are his allergies. Otherwise doing well.  Soc hx: goes to schooll at [employer] and [employer]. Doing well in school  Current Outpatient Prescriptions on File Prior to Visit  Medication Sig Dispense Refill  . emtricitabine-tenofovir (TRUVADA) 200-300 MG per tablet Take 1 tablet by mouth daily.      . raltegravir (ISENTRESS) 400 MG tablet Take 400 mg by mouth 2 (two) times daily.       Active Ambulatory Problems    Diagnosis Date Noted  . HIV (human immunodeficiency virus infection) 12/15/2010  . Seasonal allergies 12/15/2010  . Rash 04/03/2012   Resolved Ambulatory Problems    Diagnosis Date Noted  . No Resolved Ambulatory Problems   Past Medical History  Diagnosis Date  . Allergy   . HIV infection        Review of Systems  Constitutional: Negative for fever, chills, diaphoresis, activity change, appetite change, fatigue and unexpected weight change.  HENT: Negative for congestion, sore throat, rhinorrhea, sneezing, trouble swallowing and sinus pressure.  Eyes: Negative for photophobia and visual disturbance.  Respiratory: Negative for cough, chest tightness, shortness of breath, wheezing and stridor.  Cardiovascular: Negative for chest pain, palpitations and leg swelling.  Gastrointestinal: Negative for nausea, vomiting, abdominal pain, diarrhea, constipation, blood in stool, abdominal distention and anal bleeding.  Genitourinary: Negative for dysuria, hematuria, flank pain and difficulty urinating.  Musculoskeletal: Negative for myalgias, back pain, joint swelling, arthralgias and gait problem.  Skin: Negative for color change, pallor, rash and wound.  Neurological: Negative for dizziness,  tremors, weakness and light-headedness.  Hematological: Negative for adenopathy. Does not bruise/bleed easily.  Psychiatric/Behavioral: Negative for behavioral problems, confusion, sleep disturbance, dysphoric mood, decreased concentration and agitation.       Objective:   Physical Exam BP 145/78  Pulse 89  Temp 97.5 F (36.4 C) (Oral)  Wt 205 lb (92.987 kg)  Physical Exam  Constitutional: He is oriented to person, place, and time. He appears well-developed and well-nourished. No distress.  HENT:  Mouth/Throat: Oropharynx is clear and moist. No oropharyngeal exudate.  Cardiovascular: Normal rate, regular rhythm and normal heart sounds. Exam reveals no gallop and no friction rub.  No murmur heard.  Pulmonary/Chest: Effort normal and breath sounds normal. No respiratory distress. He has no wheezes.  Abdominal: Soft. Bowel sounds are normal. He exhibits no distension. There is no tenderness.  Lymphadenopathy:  He has no cervical adenopathy.  Neurological: He is alert and oriented to person, place, and time.  Skin: Skin is warm and dry. No rash noted. No erythema.  Psychiatric: He has a normal mood and affect. His behavior is normal.         Assessment & Plan:  hiv = continue with excellent adherence to truvada and raltegravir  Hypertension = will continue to watch bp at next visit to see if he needs to start an agent to control bp  Health maintenance = will get flu vax at next visit in the fall  rtc in 3 months

## 2012-06-09 ENCOUNTER — Encounter: Payer: Self-pay | Admitting: *Deleted

## 2012-06-09 ENCOUNTER — Ambulatory Visit (INDEPENDENT_AMBULATORY_CARE_PROVIDER_SITE_OTHER): Payer: Self-pay | Admitting: *Deleted

## 2012-06-09 ENCOUNTER — Ambulatory Visit (INDEPENDENT_AMBULATORY_CARE_PROVIDER_SITE_OTHER): Payer: Self-pay

## 2012-06-09 VITALS — BP 113/72 | HR 82 | Temp 98.3°F | Resp 16 | Wt 199.5 lb

## 2012-06-09 DIAGNOSIS — Z23 Encounter for immunization: Secondary | ICD-10-CM

## 2012-06-09 DIAGNOSIS — B20 Human immunodeficiency virus [HIV] disease: Secondary | ICD-10-CM

## 2012-06-09 NOTE — Progress Notes (Signed)
Patient here for his 20 month START study visit. He denies any new problems or concerns with his meds. He did have a rash in July which was felt to be scabies. The medicated cream cleared up the rash. He will return in January for the next study visit.

## 2012-06-29 ENCOUNTER — Encounter: Payer: Self-pay | Admitting: Internal Medicine

## 2012-06-29 LAB — CD4/CD8 (T-HELPER/T-SUPPRESSOR CELL)
CD4%: 30.4
CD8 % Suppressor T Cell: 55.7
CD8: 1170

## 2012-07-07 ENCOUNTER — Encounter: Payer: Self-pay | Admitting: Internal Medicine

## 2012-09-13 ENCOUNTER — Ambulatory Visit: Payer: Self-pay

## 2012-09-26 ENCOUNTER — Ambulatory Visit (INDEPENDENT_AMBULATORY_CARE_PROVIDER_SITE_OTHER): Payer: Self-pay | Admitting: *Deleted

## 2012-09-26 ENCOUNTER — Encounter: Payer: Self-pay | Admitting: *Deleted

## 2012-09-26 VITALS — BP 120/80 | HR 82 | Temp 98.6°F | Resp 18 | Wt 205.0 lb

## 2012-09-26 DIAGNOSIS — B20 Human immunodeficiency virus [HIV] disease: Secondary | ICD-10-CM

## 2012-09-26 NOTE — Progress Notes (Signed)
Pt here for START study, 20 months. Pt on treatment arm with Truvada and Isentress as his regimen. Pt admitted to mising a few doses in the past 4 months. Pt started exercising 2 months ago and now takes Green tea extract and Vit B12 supplements. Pt feeling well and denies any new signs, symptoms, or problems. Fasting labs were drawn with no problems; VSS. Pt received study medications. Given $20 gift card for study visit. Next appt scheduled for Wednesday, January 03, 2013 @ 9am. Tacey Heap RN

## 2012-09-27 LAB — HIV-1 RNA QUANT-NO REFLEX-BLD
HIV 1 RNA Quant: 365 copies/mL — ABNORMAL HIGH (ref <20)
HIV-1 RNA Quant, Log: 2.56 {Log} — ABNORMAL HIGH (ref <1.30)

## 2012-10-04 ENCOUNTER — Ambulatory Visit: Payer: Self-pay

## 2012-10-18 ENCOUNTER — Ambulatory Visit: Payer: Self-pay

## 2012-10-27 ENCOUNTER — Telehealth: Payer: Self-pay | Admitting: *Deleted

## 2012-10-27 NOTE — Telephone Encounter (Signed)
Called patient on the number listed in his chart and it is not a working number. Will forward this patient to [facility].

## 2012-10-30 ENCOUNTER — Encounter: Payer: Self-pay | Admitting: Internal Medicine

## 2012-10-30 LAB — CD4/CD8 (T-HELPER/T-SUPPRESSOR CELL): CD8 % Suppressor T Cell: 58.2

## 2012-12-13 ENCOUNTER — Encounter: Payer: Self-pay | Admitting: *Deleted

## 2012-12-13 NOTE — Progress Notes (Signed)
Patient contacted by case manager with [facility] and advised he is a study patient. He gave her a new contact number, which I added to his profile and she advised him to make an appt with his doctor asap.

## 2013-01-16 ENCOUNTER — Ambulatory Visit (INDEPENDENT_AMBULATORY_CARE_PROVIDER_SITE_OTHER): Payer: Self-pay | Admitting: *Deleted

## 2013-01-16 VITALS — BP 115/74 | HR 80 | Temp 98.1°F | Resp 18 | Wt 188.2 lb

## 2013-01-16 DIAGNOSIS — Z21 Asymptomatic human immunodeficiency virus [HIV] infection status: Secondary | ICD-10-CM

## 2013-01-16 LAB — POCT URINALYSIS DIPSTICK
Glucose, UA: NEGATIVE
Ketones, UA: NEGATIVE
Protein, UA: NEGATIVE
Spec Grav, UA: 1.025

## 2013-01-16 LAB — LIPID PANEL
Cholesterol: 166 mg/dL (ref 0–200)
Total CHOL/HDL Ratio: 4.7 Ratio
Triglycerides: 135 mg/dL (ref <150)

## 2013-01-16 LAB — COMPREHENSIVE METABOLIC PANEL
ALT: 11 U/L (ref 0–53)
BUN: 15 mg/dL (ref 6–23)
CO2: 30 mEq/L (ref 19–32)
Calcium: 9.4 mg/dL (ref 8.4–10.5)
Chloride: 103 mEq/L (ref 96–112)
Creat: 0.95 mg/dL (ref 0.50–1.35)
Glucose, Bld: 92 mg/dL (ref 70–99)

## 2013-01-16 NOTE — Progress Notes (Signed)
Pt here for START study, 24 month. He is on the treatment arm and states he has missed a couple of pills since January but has not missed any in the past 2 weeks. He has had a 17 lb INTENTIONAL weight loss. He has been working out and eating clean. He denies any new problems, findings, or symptoms. He states he is fine with his regimen but is wondering about STR; he has to take food with his current regimen because he feels this controls his abdominal discomfort. We discussed the differences between the current STR and the food restrictions for each ones. He sis show an interest in the STR that is not yet FDA approved. I told him that he would need to discuss these options with Dr. Drue Second and that if they decided to switch his regimen that it would NOT exclude him from the study. Fasting labs were drawn and vital signs are stable. EKG performed findings are NSR. Pt received $20.00 gift card for the study visit. Reminded pt to pick up regimen at the pharmacy. Next appt scheduled for Tuesday, 05/15/13 @ 10am. Tacey Heap RN

## 2013-01-17 LAB — HIV-1 RNA QUANT-NO REFLEX-BLD
HIV 1 RNA Quant: <20 copies/mL (ref <20)
HIV-1 RNA Quant, Log: <1.3 {Log} (ref <1.30)

## 2013-01-31 ENCOUNTER — Encounter: Payer: Self-pay | Admitting: Infectious Disease

## 2013-01-31 LAB — CD4/CD8 (T-HELPER/T-SUPPRESSOR CELL)
CD4%: 37
CD8: 889

## 2013-02-15 ENCOUNTER — Ambulatory Visit: Payer: Self-pay | Admitting: Internal Medicine

## 2013-02-16 ENCOUNTER — Telehealth: Payer: Self-pay | Admitting: *Deleted

## 2013-02-16 NOTE — Telephone Encounter (Signed)
Patient phone is out of service not able to leave a message

## 2013-02-16 NOTE — Telephone Encounter (Signed)
Travis: Can we refer him to bridge counseling?  He shouldn't get another appointment either.  He should be referred to walk in clinic if he calls back.  We need to get an updated phone # if he calls back. Thanks Asher Muir

## 2013-04-16 ENCOUNTER — Other Ambulatory Visit (INDEPENDENT_AMBULATORY_CARE_PROVIDER_SITE_OTHER): Payer: Self-pay

## 2013-04-16 VITALS — BP 143/88 | HR 75 | Temp 98.4°F

## 2013-04-16 DIAGNOSIS — Z113 Encounter for screening for infections with a predominantly sexual mode of transmission: Secondary | ICD-10-CM
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
## 2013-04-18 ENCOUNTER — Telehealth: Payer: Self-pay | Admitting: *Deleted

## 2013-04-18 NOTE — Telephone Encounter (Signed)
Called numbers listed for patient and was unable to get in touch with him.

## 2013-04-18 NOTE — Telephone Encounter (Signed)
Message copied by Lurlean Leyden on Wed Apr 18, 2013  3:48 PM ------      Message from: Judyann Munson      Created: Mon Apr 16, 2013  9:37 PM       We will need to get mr. Radel into clinic for pcn injection. Can you call me before lunchtime or before the afternoon clinic starts in order to make a game plan. ------

## 2013-04-24 ENCOUNTER — Encounter: Payer: Self-pay | Admitting: Internal Medicine

## 2013-04-24 ENCOUNTER — Ambulatory Visit (INDEPENDENT_AMBULATORY_CARE_PROVIDER_SITE_OTHER): Payer: Self-pay | Admitting: Internal Medicine

## 2013-04-24 VITALS — BP 120/75 | HR 73 | Temp 98.3°F | Ht 71.0 in | Wt 176.8 lb

## 2013-04-24 DIAGNOSIS — K626 Ulcer of anus and rectum: Secondary | ICD-10-CM

## 2013-04-24 DIAGNOSIS — B86 Scabies: Secondary | ICD-10-CM

## 2013-04-24 DIAGNOSIS — A539 Syphilis, unspecified: Secondary | ICD-10-CM | POA: Insufficient documentation

## 2013-04-24 HISTORY — DX: Ulcer of anus and rectum: K62.6

## 2013-04-24 MED ORDER — PENICILLIN G BENZATHINE 1200000 UNIT/2ML IM SUSP
1.2000 10*6.[IU] | Freq: Once | INTRAMUSCULAR | Status: AC
  Administered 2013-04-24: 1.2 10*6.[IU] via INTRAMUSCULAR

## 2013-04-24 NOTE — Patient Instructions (Signed)
Pt to return next week and the week after for total of 3 weeks of treatment.  Appointments made for return visits.

## 2013-04-24 NOTE — Progress Notes (Signed)
Patient ID: Alexander Hancock, male   DOB: June 26, 1991, 22 y.o.   MRN: [id number]          [facility]  Patient Active Problem List   Diagnosis Date Noted  . 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 Perirectal ulcer 04/24/2013  . Rash 04/03/2012  . HIV (human immunodeficiency virus infection) 12/15/2010  . Seasonal allergies 12/15/2010    Patient's Medications  New Prescriptions   No medications on file  Previous Medications   EMTRICITABINE-TENOFOVIR (TRUVADA) 200-300 MG PER TABLET    Take 1 tablet by mouth daily.   RALTEGRAVIR (ISENTRESS) 400 MG TABLET    Take 400 mg by mouth 2 (two) times daily.  Modified Medications   No medications on file  Discontinued Medications   No medications on file    Subjective: Alexander Hancock is seen on a work in basis. He has had 3 male sexual partners in the last 6 months. One of his partners was recently tested and found to have syphilis leading him to come in for blood work. His RPR was also reactive a titer of 1:32. Urine for GC and Chlamydia testing was negative. He has been feeling well but his partner are did tell him that he had a small sore next is rectum about 2 weeks ago. He does not think it has changed during that time. It is not painful. He says that the one partner her tested positive for syphilis did not use condoms last time they were sexually active. The last time he been tested for syphilis prior to this test was in March of 2012.  He's had no problems obtaining or tolerating his Truvada and Isentress. He estimates that he misses one to 2 doses of his medication he to month. When he does miss it is most often his morning dose when he forgets to take it before heading off to work at [place] [employer]. He does not have a cell phone or alarm clock that he could set to remind him.  Review of Systems: Pertinent items are noted in HPI.  Past Medical History  Diagnosis Date  . Allergy   . HIV infection   . Perirectal ulcer  04/24/2013    History  Substance Use Topics  . Smoking status: Never Smoker   . Smokeless tobacco: Never Used  . Alcohol Use: 0.5 oz/week    1 drink(s) per week    Family History  Problem Relation Age of Onset  . Multiple sclerosis Mother     No Known Allergies  Objective: Temp: 98.3 F (36.8 C) (08/19 1018) Temp src: Oral (08/19 1018) BP: 120/75 mmHg (08/19 1018) Pulse Rate: 73 (08/19 1018)  General: He is in no distress Oral: No oropharyngeal lesions Skin: No rash Lymph nodes: No palpable adenopathy Lungs: Clear Cor: Regular S1 and S2 with no murmurs GU: No penile or scrotal lesions. He has a 5 mm ulcer in the perirectal region at the 10:00 position. It is nontender without any fluctuance or drainage.  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 I am not sure what the etiology of his small perirectal ulcer is. It is unlikely to be a primary syphilitic chancre given that his RPR is strongly positive. He could also have acquired perirectal herpes but the lesions are not painful. I suggest that we watch this lesion were healing and possible recurrence. I talked to him about the importance of careful partners collection and using safer sexual practices at all times. Fortunately his HIV has been under good control but there is a possibility he could be superinfected with multidrug resistant strains. I talked to him about the importance of not missing doses of his antiretroviral medications. I suggested that he keep a small supply of his  medicines with him at work in case he misses his morning dose at home. He agrees to followup in 3 months with Dr. Drue Second.  Plan: 1. Continue Truvada and Isentress 2. Benzathine penicillin 2.4 million units weekly x3 3. Followup in 3 months   Cliffton Asters, MD [facility] Infectious Disease [facility] [phone] pager   [phone] cell 04/24/2013, 10:56 AM

## 2013-04-24 NOTE — Addendum Note (Signed)
Addended by: Jennet Maduro D on: 04/24/2013 12:08 PM   Modules accepted: Orders

## 2013-05-02 ENCOUNTER — Ambulatory Visit (INDEPENDENT_AMBULATORY_CARE_PROVIDER_SITE_OTHER): Payer: Self-pay | Admitting: *Deleted

## 2013-05-02 DIAGNOSIS — Z23 Encounter for immunization: Secondary | ICD-10-CM

## 2013-05-02 DIAGNOSIS — A539 Syphilis, unspecified: Secondary | ICD-10-CM

## 2013-05-02 MED ORDER — PENICILLIN G BENZATHINE 1200000 UNIT/2ML IM SUSP
1.2000 10*6.[IU] | Freq: Once | INTRAMUSCULAR | Status: AC
  Administered 2013-05-02: 1.2 10*6.[IU] via INTRAMUSCULAR

## 2013-05-02 NOTE — Patient Instructions (Signed)
Return in one week for third and last injections.  Use condoms with each sexual encounter.

## 2013-05-10 ENCOUNTER — Ambulatory Visit (INDEPENDENT_AMBULATORY_CARE_PROVIDER_SITE_OTHER): Payer: Self-pay | Admitting: Licensed Clinical Social Worker

## 2013-05-10 DIAGNOSIS — A539 Syphilis, unspecified: Secondary | ICD-10-CM

## 2013-05-10 MED ORDER — PENICILLIN G BENZATHINE 1200000 UNIT/2ML IM SUSP
1.2000 10*6.[IU] | Freq: Once | INTRAMUSCULAR | Status: AC
  Administered 2013-05-10: 1.2 10*6.[IU] via INTRAMUSCULAR

## 2013-06-07 ENCOUNTER — Ambulatory Visit (INDEPENDENT_AMBULATORY_CARE_PROVIDER_SITE_OTHER): Payer: Self-pay | Admitting: *Deleted

## 2013-06-07 ENCOUNTER — Other Ambulatory Visit (INDEPENDENT_AMBULATORY_CARE_PROVIDER_SITE_OTHER): Payer: Self-pay

## 2013-06-07 VITALS — BP 118/78 | HR 78 | Temp 98.1°F | Resp 16 | Wt 186.5 lb

## 2013-06-07 DIAGNOSIS — Z21 Asymptomatic human immunodeficiency virus [HIV] infection status: Secondary | ICD-10-CM

## 2013-06-07 DIAGNOSIS — B20 Human immunodeficiency virus [HIV] disease: Secondary | ICD-10-CM

## 2013-06-07 LAB — CD4/CD8 (T-HELPER/T-SUPPRESSOR CELL)
CD4%: 35.1
CD4: 807
CD8 % Suppressor T Cell: 47

## 2013-06-07 NOTE — Progress Notes (Signed)
Ppt here for START, 28 month. Assessment unchanged from last study visit.  Non-fasting labs drawn with no problems. Questionnaires completed. He received $20 gift card. Tacey Heap RN

## 2013-06-26 ENCOUNTER — Encounter: Payer: Self-pay | Admitting: Internal Medicine

## 2013-07-12 ENCOUNTER — Other Ambulatory Visit: Payer: Self-pay

## 2013-07-14 ENCOUNTER — Encounter (HOSPITAL_COMMUNITY): Payer: Self-pay | Admitting: Emergency Medicine

## 2013-07-14 ENCOUNTER — Emergency Department (INDEPENDENT_AMBULATORY_CARE_PROVIDER_SITE_OTHER)
Admission: EM | Admit: 2013-07-14 | Discharge: 2013-07-14 | Disposition: A | Discharge status: Home / Self Care | Payer: Self-pay | Source: Home / Self Care | Attending: Family Medicine | Admitting: Family Medicine

## 2013-07-14 DIAGNOSIS — J00 Acute nasopharyngitis [common cold]: Secondary | ICD-10-CM

## 2013-07-14 DIAGNOSIS — G44209 Tension-type headache, unspecified, not intractable: Secondary | ICD-10-CM

## 2013-07-14 NOTE — ED Provider Notes (Signed)
CSN: [id number]     Arrival date & time 07/14/13  1720 History   First MD Initiated Contact with Patient 07/14/13 1747     Chief Complaint  Patient presents with  . Fever   (Consider location/radiation/quality/duration/timing/severity/associated sxs/prior Treatment) HPI Comments: 22 year old male presents complaining of mild headache. This began 3 days ago as headache, subjective fever, sore throat, runny nose. All his symptoms have resolved except for the headache. The headache is much better today than it was yesterday. He is not taking anything to help his symptoms. Denies any other associated symptoms.  Patient is a 22 y.o. male presenting with fever.  Fever Associated symptoms: headaches, rhinorrhea and sore throat   Associated symptoms: no chest pain, no chills, no cough, no diarrhea, no dysuria, no myalgias, no nausea, no rash and no vomiting     Past Medical History  Diagnosis Date  . Allergy   . HIV infection   . Perirectal ulcer 04/24/2013   History reviewed. No pertinent past surgical history. Family History  Problem Relation Age of Onset  . Multiple sclerosis Mother    History  Substance Use Topics  . Smoking status: Never Smoker   . Smokeless tobacco: Never Used  . Alcohol Use: 0.5 oz/week    1 drink(s) per week    Review of Systems  Constitutional: Positive for fever. Negative for chills and fatigue.  HENT: Positive for rhinorrhea and sore throat.   Eyes: Negative for visual disturbance.  Respiratory: Negative for cough and shortness of breath.   Cardiovascular: Negative for chest pain, palpitations and leg swelling.  Gastrointestinal: Negative for nausea, vomiting, abdominal pain, diarrhea and constipation.  Genitourinary: Negative for dysuria, urgency, frequency and hematuria.  Musculoskeletal: Negative for arthralgias, myalgias, neck pain and neck stiffness.  Skin: Negative for rash.  Neurological: Positive for headaches. Negative for dizziness, weakness  and light-headedness.    Allergies  Review of patient's allergies indicates no known allergies.  Home Medications   Current Outpatient Rx  Name  Route  Sig  Dispense  Refill  . emtricitabine-tenofovir (TRUVADA) 200-300 MG per tablet   Oral   Take 1 tablet by mouth daily.         . raltegravir (ISENTRESS) 400 MG tablet   Oral   Take 400 mg by mouth 2 (two) times daily.         Marland Kitchen EXPIRED: emtricitabine-tenofovir (TRUVADA) 200-300 MG per tablet   Oral   Take 1 tablet by mouth daily.         Marland Kitchen EXPIRED: raltegravir (ISENTRESS) 400 MG tablet   Oral   Take 400 mg by mouth 2 (two) times daily.          BP 136/81  Pulse 82  Temp(Src) 98.3 F (36.8 C) (Oral)  Resp 16  SpO2 100% Physical Exam  Nursing note and vitals reviewed. Constitutional: He is oriented to person, place, and time. He appears well-developed and well-nourished. No distress.  HENT:  Head: Normocephalic.  Right Ear: External ear normal.  Left Ear: External ear normal.  Nose: Nose normal.  Mouth/Throat: Oropharynx is clear and moist. No oropharyngeal exudate.  Eyes: Conjunctivae and EOM are normal. Pupils are equal, round, and reactive to light.  Neck: Normal range of motion. Neck supple. No JVD present.  Pulmonary/Chest: Effort normal. No respiratory distress.  Neurological: He is alert and oriented to person, place, and time. He has normal reflexes. He displays normal reflexes. No cranial nerve deficit. He exhibits normal muscle tone. Coordination normal.  Skin: Skin is warm and dry. No rash noted. He is not diaphoretic.  Psychiatric: He has a normal mood and affect. Judgment normal.    ED Course  Procedures (including critical care time) Labs Review Labs Reviewed - No data to display Imaging Review No results found.    MDM   1. Acute nasopharyngitis (common cold)   2. Tension headache    All symptoms are resolving without treatment. Nothing to do at this time. Followup when  necessary    Graylon Good, PA-C 07/14/13 1815

## 2013-07-14 NOTE — ED Notes (Signed)
C/o fever onset Tuesday.  Headache onset Wednesday. C/o sore throat and runny nose onset Tuesday.  Sore throat gone now.

## 2013-07-15 NOTE — ED Provider Notes (Signed)
Medical screening examination/treatment/procedure(s) were performed by resident physician or non-physician practitioner and as supervising physician I was immediately available for consultation/collaboration.   KINDL,JAMES DOUGLAS MD.   James D Kindl, MD 07/15/13 0919 

## 2013-07-26 ENCOUNTER — Telehealth: Payer: Self-pay | Admitting: *Deleted

## 2013-07-26 ENCOUNTER — Ambulatory Visit: Payer: Self-pay | Admitting: Internal Medicine

## 2013-07-26 NOTE — Telephone Encounter (Signed)
Called patient about his missed appt today and his number is disconnected.

## 2013-08-18 ENCOUNTER — Encounter (HOSPITAL_COMMUNITY): Payer: Self-pay | Admitting: Emergency Medicine

## 2013-08-18 ENCOUNTER — Emergency Department (HOSPITAL_COMMUNITY)
Admission: EM | Admit: 2013-08-18 | Discharge: 2013-08-19 | Disposition: A | Discharge status: Home / Self Care | Payer: Self-pay | Attending: Emergency Medicine | Admitting: Emergency Medicine

## 2013-08-18 DIAGNOSIS — Z23 Encounter for immunization: Secondary | ICD-10-CM | POA: Insufficient documentation

## 2013-08-18 DIAGNOSIS — Z8719 Personal history of other diseases of the digestive system: Secondary | ICD-10-CM | POA: Insufficient documentation

## 2013-08-18 DIAGNOSIS — S60229A Contusion of unspecified hand, initial encounter: Secondary | ICD-10-CM | POA: Insufficient documentation

## 2013-08-18 DIAGNOSIS — W503XXA Accidental bite by another person, initial encounter: Secondary | ICD-10-CM

## 2013-08-18 DIAGNOSIS — S60221A Contusion of right hand, initial encounter: Secondary | ICD-10-CM

## 2013-08-18 DIAGNOSIS — Z21 Asymptomatic human immunodeficiency virus [HIV] infection status: Secondary | ICD-10-CM | POA: Insufficient documentation

## 2013-08-18 DIAGNOSIS — Z79899 Other long term (current) drug therapy: Secondary | ICD-10-CM | POA: Insufficient documentation

## 2013-08-18 MED ORDER — IBUPROFEN 400 MG PO TABS
800.0000 mg | ORAL_TABLET | Freq: Once | ORAL | Status: AC
  Administered 2013-08-18: 800 mg via ORAL
  Filled 2013-08-18: qty 4

## 2013-08-18 MED ORDER — AMOXICILLIN-POT CLAVULANATE 875-125 MG PO TABS
1.0000 | ORAL_TABLET | Freq: Once | ORAL | Status: AC
  Administered 2013-08-18: 1 via ORAL
  Filled 2013-08-18: qty 1

## 2013-08-18 MED ORDER — TETANUS-DIPHTH-ACELL PERTUSSIS 5-2.5-18.5 LF-MCG/0.5 IM SUSP
0.5000 mL | Freq: Once | INTRAMUSCULAR | Status: AC
  Administered 2013-08-18: 0.5 mL via INTRAMUSCULAR
  Filled 2013-08-18: qty 0.5
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
## 2013-08-19 ENCOUNTER — Emergency Department (HOSPITAL_COMMUNITY): Payer: Self-pay

## 2013-08-19 MED ORDER — IBUPROFEN 800 MG PO TABS: 800.0000 mg | ORAL_TABLET | Freq: Three times a day (TID) | ORAL | Status: DC

## 2013-08-19 MED ORDER — AMOXICILLIN-POT CLAVULANATE 875-125 MG PO TABS: 1.0000 | ORAL_TABLET | Freq: Two times a day (BID) | ORAL | Status: DC

## 2013-08-19 NOTE — ED Provider Notes (Signed)
Medical screening examination/treatment/procedure(s) were performed by non-physician practitioner and as supervising physician I was immediately available for consultation/collaboration.  Darlys Gales, MD 08/19/13 [phone]

## 2013-09-03 ENCOUNTER — Emergency Department (INDEPENDENT_AMBULATORY_CARE_PROVIDER_SITE_OTHER)
Admission: EM | Admit: 2013-09-03 | Discharge: 2013-09-03 | Disposition: A | Discharge status: Home / Self Care | Payer: Self-pay | Source: Home / Self Care | Attending: Emergency Medicine | Admitting: Emergency Medicine

## 2013-09-03 ENCOUNTER — Encounter (HOSPITAL_COMMUNITY): Payer: Self-pay | Admitting: Emergency Medicine

## 2013-09-03 DIAGNOSIS — K625 Hemorrhage of anus and rectum: Secondary | ICD-10-CM

## 2013-09-03 DIAGNOSIS — R197 Diarrhea, unspecified: Secondary | ICD-10-CM

## 2013-09-03 LAB — POCT I-STAT, CHEM 8
BUN: 16 mg/dL (ref 6–23)
Calcium, Ion: 1.25 mmol/L — ABNORMAL HIGH (ref 1.12–1.23)
Chloride: 102 mEq/L (ref 96–112)
Creatinine, Ser: 1 mg/dL (ref 0.50–1.35)
Glucose, Bld: 96 mg/dL (ref 70–99)
TCO2: 29 mmol/L (ref 0–100)

## 2013-09-03 NOTE — ED Provider Notes (Signed)
CSN: [id number]     Arrival date & time 09/03/13  1752 History   First MD Initiated Contact with Patient 09/03/13 2036     Chief Complaint  Patient presents with  . Abdominal Pain   (Consider location/radiation/quality/duration/timing/severity/associated sxs/prior Treatment) Patient is a 22 y.o. male presenting with abdominal pain. The history is provided by the patient.  Abdominal Pain Pain location:  Generalized Pain quality: cramping   Duration:  2 days Timing:  Intermittent Chronicity:  New Associated symptoms: chills   Associated symptoms: no chest pain, no cough, no dysuria, no fever, no nausea, no shortness of breath, no sore throat and no vomiting    Alexander Hancock is a 22 y.o. male who presents with abdominal cramping that started 2 days ago. He started with cramping and diarrhea but no nausea or vomiting. He seems to be better but today when he had a BM he noted there was a dark red color to his stool. The cramping today is mild and he has had 3 stools that were watery. He rates the cramping as 3/10. He has been eating soup and drinking gator aid, but then had a Biscuit with bacon egg and cheese from [employer]. The diarrhea came back. PMH significant for HIV positive.    Past Medical History  Diagnosis Date  . Allergy   . HIV infection   . Perirectal ulcer 04/24/2013   No past surgical history on file. Family History  Problem Relation Age of Onset  . Multiple sclerosis Mother    History  Substance Use Topics  . Smoking status: Never Smoker   . Smokeless tobacco: Never Used  . Alcohol Use: 0.5 oz/week    1 drink(s) per week    Review of Systems  Constitutional: Positive for chills. Negative for fever.  HENT: Negative for congestion, ear pain, sore throat and trouble swallowing.   Eyes: Negative for pain and visual disturbance.  Respiratory: Negative for cough and shortness of breath.   Cardiovascular: Negative for chest pain.  Gastrointestinal: Positive for  abdominal pain (cramping). Negative for nausea and vomiting.  Genitourinary: Negative for dysuria, urgency and frequency.  Musculoskeletal: Negative for back pain, neck pain and neck stiffness.  Skin: Negative for rash.  Neurological: Negative for light-headedness and headaches.  Psychiatric/Behavioral: Negative for confusion. The patient is not nervous/anxious.     Allergies  Review of patient's allergies indicates no known allergies.  Home Medications   Current Outpatient Rx  Name  Route  Sig  Dispense  Refill  . amoxicillin-clavulanate (AUGMENTIN) 875-125 MG per tablet   Oral   Take 1 tablet by mouth 2 (two) times daily.   13 tablet   0   . emtricitabine-tenofovir (TRUVADA) 200-300 MG per tablet   Oral   Take 1 tablet by mouth daily.         Marland Kitchen ibuprofen (ADVIL,MOTRIN) 800 MG tablet   Oral   Take 1 tablet (800 mg total) by mouth 3 (three) times daily.   30 tablet   0   . raltegravir (ISENTRESS) 400 MG tablet   Oral   Take 400 mg by mouth 2 (two) times daily.          BP 137/81  Pulse 74  Temp(Src) 98 F (36.7 C) (Oral)  Resp 14  SpO2 99% Physical Exam  Nursing note and vitals reviewed. Constitutional: He is oriented to person, place, and time. He appears well-developed and well-nourished. No distress.  HENT:  Head: Normocephalic and atraumatic.  Eyes:  Conjunctivae and EOM are normal.  Neck: Neck supple.  Cardiovascular: Normal rate and regular rhythm.   Pulmonary/Chest: Effort normal.  Abdominal: Soft.  Minimal tenderness with deep palpation, no rebound or guarding.   Genitourinary: Rectal exam shows no mass, no tenderness and anal tone normal. Guaiac positive stool.  Musculoskeletal: Normal range of motion.  Neurological: He is alert and oriented to person, place, and time. No cranial nerve deficit.  Skin: Skin is warm and dry.  Psychiatric: He has a normal mood and affect. His behavior is normal.   Results for orders placed during the hospital  encounter of 09/03/13 (from the past 24 hour(s))  POCT I-STAT, CHEM 8     Status: Abnormal   Collection Time    09/03/13  9:15 PM      Result Value Range   Sodium 143  135 - 145 mEq/L   Potassium 3.8  3.5 - 5.1 mEq/L   Chloride 102  96 - 112 mEq/L   BUN 16  6 - 23 mg/dL   Creatinine, Ser 0.99  0.50 - 1.35 mg/dL   Glucose, Bld 96  70 - 99 mg/dL   Calcium, Ion 8.33 (*) 1.12 - 1.23 mmol/L   TCO2 29  0 - 100 mmol/L   Hemoglobin 15.6  13.0 - 17.0 g/dL   HCT 82.5  05.3 - 97.6 %    ED Course: I discussed this case with Dr. Lorenz Coaster.   Procedures  MDM  22 y.o. male with diarrhea x 2 days. Noted blood in stool today. He is not orthostatic and has a normal I-Stat 8. The patient is HIV positive. I have instructed him to go back to his clear liquid diet tonight and advance to the B.R.A.T. Diet. He is to call his PCP in the infectious disease clinic tomorrow for follow up he will go to the ED for any emergent problem. Stable for discharge without any immediate complications.     [place], [place] 09/03/13 2133

## 2013-09-03 NOTE — ED Notes (Signed)
C/o abdominal pain.  Reports diarrhea for 2 days.  Reports dark blood in stool.

## 2013-09-04 NOTE — ED Provider Notes (Signed)
Medical screening examination/treatment/procedure(s) were performed by non-physician practitioner and as supervising physician I was immediately available for consultation/collaboration.  Leslee Home, M.D.  Reuben Likes, MD 09/04/13 [phone]

## 2013-10-04 ENCOUNTER — Ambulatory Visit (INDEPENDENT_AMBULATORY_CARE_PROVIDER_SITE_OTHER): Payer: Self-pay | Admitting: *Deleted

## 2013-10-04 ENCOUNTER — Other Ambulatory Visit: Payer: Self-pay | Admitting: Infectious Disease

## 2013-10-04 VITALS — BP 128/74 | HR 87 | Temp 98.0°F | Resp 16 | Wt 194.0 lb

## 2013-10-04 DIAGNOSIS — Z21 Asymptomatic human immunodeficiency virus [HIV] infection status: Secondary | ICD-10-CM

## 2013-10-04 DIAGNOSIS — B20 Human immunodeficiency virus [HIV] disease: Secondary | ICD-10-CM

## 2013-10-04 NOTE — Progress Notes (Signed)
GI symptoms and bite mark to right side of neck (below ear) has resolved. He has been adherent to his regimen but he feels that Truvada makes him hungry and he does not like that. Vital signs are stable and blood drawn at 1125. Next appt scheduled for 01/21/14 @ 11am. Eliezer Champagne RN

## 2013-10-05 LAB — CD4/CD8 (T-HELPER/T-SUPPRESSOR CELL)
CD4%: 29.2
CD4: 642
CD8 % Suppressor T Cell: 51
CD8: 1122

## 2013-10-07 LAB — HIV-1 RNA QUANT-NO REFLEX-BLD
HIV 1 RNA QUANT: 1576 {copies}/mL — AB (ref <20)
HIV-1 RNA QUANT, LOG: 3.2 {Log} — AB (ref <1.30)

## 2013-10-09 NOTE — Progress Notes (Signed)
Thanks Margaretmary Bayley, may need bridge counselor to go get him

## 2013-10-09 NOTE — Addendum Note (Signed)
Addended by: Dolan Amen D on: 10/09/2013 11:50 AM   Modules accepted: Orders

## 2013-10-17 LAB — HIV-1 INTEGRASE GENOTYPE

## 2013-10-23 ENCOUNTER — Other Ambulatory Visit: Payer: Self-pay | Admitting: Infectious Disease

## 2013-10-23 ENCOUNTER — Telehealth: Payer: Self-pay | Admitting: Infectious Disease

## 2013-10-23 LAB — HIV-1 GENOTYPR PLUS

## 2013-10-23 NOTE — Telephone Encounter (Signed)
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 Fortunately his genotype shows no INI resistance  He NEEDS TO STOP  HIS CURRENT REGIMEN IMMEDIATELY AND COME IN FOR VISIT AND CONSTRUCTION OF A NEW REGIMEN  LIKELY WITH PREZCOBIX AND TRUVADA OR   EVOTAZ AND TRUVADA

## 2013-10-24 ENCOUNTER — Telehealth: Payer: Self-pay | Admitting: *Deleted

## 2013-10-24 ENCOUNTER — Encounter: Payer: Self-pay | Admitting: *Deleted

## 2013-10-24 NOTE — Telephone Encounter (Signed)
Telephone number not working.  Unable to reach pt to schedule appt w/ Dr. Tommy Medal.  Will send a letter.

## 2013-10-24 NOTE — Progress Notes (Signed)
Patient ID: Alexander Hancock, male   DOB: March 11, 1991, 23 y.o.   MRN: [id number]   Based on results of genotype from 10/04/2013 and discussion with Dr. Tommy Medal [name] needs to stop taking his ARV regimen immediately and needs to be by either Dr. Tommy Medal or Dr. Baxter Flattery ASAP. I have attempted contact [name] through phone [phone]) and email ([name] and [email or web address]). I left message stating the above and give him the office number to call and make an appointment [phone]).   For study we will need a 80mL EDTA drawn at his next visit.  Eliezer Champagne RN

## 2013-10-24 NOTE — Telephone Encounter (Signed)
Can we get a bridge counselor to Him. Margaretmary Bayley also trying to reach him he is in START which has been providing MEDS FOR FREE!

## 2013-10-24 NOTE — Progress Notes (Signed)
He should probably be changed to Prezista, Norvir and Truvada or PREZCOBIX and TRuvada when PREZCOBIX is added to START formulary. We could explore other options such as [place] as well with food and no antacids

## 2013-10-25 NOTE — Telephone Encounter (Signed)
Bridge Counselor referral given to [name] this afternoon.  10/25/13.  Alexander Hancock

## 2013-10-25 NOTE — Telephone Encounter (Signed)
Let's see what happens with the letter.  OK?  Alexander Hancock

## 2013-10-25 NOTE — Telephone Encounter (Signed)
All I know is he needs to get in asap and get meds straightened out.

## 2013-10-25 NOTE — Telephone Encounter (Signed)
Thanks Denise! 

## 2013-11-06 ENCOUNTER — Encounter: Payer: Self-pay | Admitting: *Deleted

## 2013-11-06 ENCOUNTER — Ambulatory Visit (INDEPENDENT_AMBULATORY_CARE_PROVIDER_SITE_OTHER): Payer: Self-pay | Admitting: Internal Medicine

## 2013-11-06 ENCOUNTER — Encounter: Payer: Self-pay | Admitting: Internal Medicine

## 2013-11-06 VITALS — BP 139/75 | HR 97 | Temp 98.6°F | Wt 200.0 lb

## 2013-11-06 DIAGNOSIS — A539 Syphilis, unspecified: Secondary | ICD-10-CM

## 2013-11-06 DIAGNOSIS — Z91199 Patient's noncompliance with other medical treatment and regimen due to unspecified reason: Secondary | ICD-10-CM

## 2013-11-06 DIAGNOSIS — B20 Human immunodeficiency virus [HIV] disease: Secondary | ICD-10-CM

## 2013-11-06 DIAGNOSIS — Z21 Asymptomatic human immunodeficiency virus [HIV] infection status: Secondary | ICD-10-CM

## 2013-11-06 DIAGNOSIS — Z9119 Patient's noncompliance with other medical treatment and regimen: Secondary | ICD-10-CM

## 2013-11-06 DIAGNOSIS — Z113 Encounter for screening for infections with a predominantly sexual mode of transmission: Secondary | ICD-10-CM

## 2013-11-07 LAB — T.PALLIDUM AB, TOTAL

## 2013-11-07 LAB — RPR TITER

## 2013-11-07 LAB — RPR: RPR: REACTIVE — AB

## 2013-11-07 NOTE — Progress Notes (Signed)
Subjective:    Patient ID: Alexander Hancock, male    DOB: Jan 31, 1991, 23 y.o.   MRN: [id number]  HPI Alexander Hancock is a 23yo M with HIV, CD 4 count of 807/VL 1576 enrolled in START trial, currently on RLG/truvada. 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 He states that he has been on this regimen for sometime, but recently having difficulty with taking truvada where he notices having nausea/vomiting. He states that he takes RLG BID but is missing 3-4 doses of truvada per week. Unclear, he states that the symptoms are better when he eats dinner. 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 His most consistent meal of the day is dinner. He has stopped taking his meds roughly 2 wks ago since we have asked him to stop due to building resistance with intermittent truvada use.  Current Outpatient Prescriptions on File Prior to Visit  Medication Sig Dispense Refill  . ibuprofen (ADVIL,MOTRIN) 800 MG tablet Take 1 tablet (800 mg total) by mouth 3 (three) times daily.  30 tablet  0   No current facility-administered medications on file prior to visit.   Active Ambulatory Problems    Diagnosis Date Noted  . HIV (human immunodeficiency virus infection) 12/15/2010  . Seasonal allergies 12/15/2010  . Rash 04/03/2012  . 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 Perirectal ulcer 04/24/2013   Resolved Ambulatory Problems    Diagnosis Date Noted  . Scabies 04/24/2013   Past Medical History  Diagnosis Date  . Allergy   . HIV infection       Review of Systems   Constitutional: Negative for fever, chills, diaphoresis, activity change, appetite change, fatigue and unexpected weight change.  HENT: Negative for congestion, sore throat, rhinorrhea, sneezing, trouble swallowing and sinus pressure.  Eyes: Negative for photophobia and visual disturbance.  Respiratory: Negative for cough, chest tightness, shortness of breath,  wheezing and stridor.  Cardiovascular: Negative for chest pain, palpitations and leg swelling.  Gastrointestinal: Negative for nausea, vomiting, abdominal pain, diarrhea, constipation, blood in stool, abdominal distention and anal bleeding.  Genitourinary: Negative for dysuria, hematuria, flank pain and difficulty urinating.  Musculoskeletal: Negative for myalgias, back pain, joint swelling, arthralgias and gait problem.  Skin: Negative for color change, pallor, rash and wound.  Neurological: Negative for dizziness, tremors, weakness and light-headedness.  Hematological: Negative for adenopathy. Does not bruise/bleed easily.  Psychiatric/Behavioral: Negative for behavioral problems, confusion, sleep disturbance, dysphoric mood, decreased concentration and agitation.       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 We have discussed various options to replace his current regimen. He is opting for once a day regimen and less pill burden. We will try complera plus tivicay. Alternatively, could do pi based regimen with new formulations plus truvada. I question if he has pill adversion to truvada now based upon his history. Mentioned that he needs to take it with meals to increase absorption of complera. Will call him in a week to see how new regimen is treating him. Will also check to see if he needs anti-emetic for his meds.  We spent 30 minutes discussing importance of adherence.   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 Will repeat RPR to see that it has trended down accordingly.  rtc in 4 wk

## 2013-11-14 ENCOUNTER — Other Ambulatory Visit: Payer: Self-pay | Admitting: *Deleted

## 2013-11-14 LAB — HLA B*5701: HLA-B 5701 W/RFLX HLA-B HIGH: NEGATIVE

## 2013-11-14 MED ORDER — DOLUTEGRAVIR SODIUM 50 MG PO TABS: 50.0000 mg | ORAL_TABLET | Freq: Every day | ORAL | Status: DC

## 2013-11-22 ENCOUNTER — Encounter: Payer: Self-pay | Admitting: Internal Medicine

## 2013-11-29 ENCOUNTER — Ambulatory Visit (INDEPENDENT_AMBULATORY_CARE_PROVIDER_SITE_OTHER): Payer: Self-pay | Admitting: Internal Medicine

## 2013-11-29 ENCOUNTER — Encounter: Payer: Self-pay | Admitting: Internal Medicine

## 2013-11-29 ENCOUNTER — Ambulatory Visit: Payer: Self-pay

## 2013-11-29 VITALS — BP 130/83 | HR 85 | Temp 98.3°F | Wt 204.0 lb

## 2013-11-29 DIAGNOSIS — B20 Human immunodeficiency virus [HIV] disease: Secondary | ICD-10-CM

## 2013-11-29 DIAGNOSIS — A539 Syphilis, unspecified: Secondary | ICD-10-CM

## 2013-11-29 DIAGNOSIS — R110 Nausea: Secondary | ICD-10-CM

## 2013-11-29 MED ORDER — PENICILLIN G BENZATHINE 1200000 UNIT/2ML IM SUSP
1.2000 10*6.[IU] | Freq: Once | INTRAMUSCULAR | Status: AC
  Administered 2013-11-29: 1.2 10*6.[IU] via INTRAMUSCULAR

## 2013-11-29 MED ORDER — PROMETHAZINE HCL 25 MG PO TABS: 25.0000 mg | ORAL_TABLET | Freq: Three times a day (TID) | ORAL | Status: DC | PRN

## 2013-11-29 MED ORDER — EMTRICITAB-RILPIVIR-TENOFOV DF 200-25-300 MG PO TABS: 1.0000 | ORAL_TABLET | Freq: Every day | ORAL | Status: DC

## 2013-11-29 MED ORDER — DOLUTEGRAVIR SODIUM 50 MG PO TABS: 50.0000 mg | ORAL_TABLET | Freq: Every day | ORAL | Status: DC

## 2013-11-29 NOTE — Progress Notes (Signed)
   Subjective:    Patient ID: Alexander Hancock, male    DOB: 07/30/1991, 23 y.o.   MRN: [id number]  HPI 23yo M with HIV, Cd 4 count of 642/VL1576, previously on RLG/truvada but had difficulty with truvada, missing frequent doses due to nausea. He also had previous recent partner now incarcerated that he found out had HIV and probably syphilis, as likely source of re-infection. Had difficulty getting in touch with him until we emailed him. We discussed starting complera nad tivicay since his adap is now approved. He is disappointed that he has syphilis again. He is not currently having any sores or rash. His previous partner had been with numerous folks during and after they split up.  Current Outpatient Prescriptions on File Prior to Visit  Medication Sig Dispense Refill  . ibuprofen (ADVIL,MOTRIN) 800 MG tablet Take 1 tablet (800 mg total) by mouth 3 (three) times daily.  30 tablet  0   No current facility-administered medications on file prior to visit.   Active Ambulatory Problems    Diagnosis Date Noted  . HIV (human immunodeficiency virus infection) 12/15/2010  . Seasonal allergies 12/15/2010  . Rash 04/03/2012  . 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 Perirectal ulcer 04/24/2013   Resolved Ambulatory Problems    Diagnosis Date Noted  . Scabies 04/24/2013   Past Medical History  Diagnosis Date  . Allergy   . HIV infection        Review of Systems 10 point ROS is negative    Objective:   Physical Exam BP 130/83  Pulse 85  Temp(Src) 98.3 F (36.8 C) (Oral)  Wt 204 lb (92.534 kg) Physical Exam  Constitutional: He is oriented to person, place, and time. He appears well-developed and well-nourished. No distress.  HENT:  Mouth/Throat: Oropharynx is clear and moist. No oropharyngeal exudate.   Lymphadenopathy:  He has no cervical adenopathy.  Neurological: He is alert and oriented to person, place, and time.  Skin: Skin is warm and dry. No rash noted. No erythema.  Psychiatric: He  has a normal mood and affect. His behavior is normal.       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 Received 1st inj today  hiv = now adap approved. Will start complera and tivicay  Adherence counseling for 15 min of visit  Nausea = will give phenergan prn

## 2013-11-30 ENCOUNTER — Telehealth: Payer: Self-pay | Admitting: *Deleted

## 2013-11-30 NOTE — Telephone Encounter (Signed)
Pharmacy called to get the patient ADAP info and advised them the patient in not approved at this time. Advised we will call them when he is.

## 2013-12-06 ENCOUNTER — Ambulatory Visit (INDEPENDENT_AMBULATORY_CARE_PROVIDER_SITE_OTHER): Payer: Self-pay | Admitting: *Deleted

## 2013-12-06 DIAGNOSIS — A539 Syphilis, unspecified: Secondary | ICD-10-CM

## 2013-12-06 MED ORDER — PENICILLIN G BENZATHINE 1200000 UNIT/2ML IM SUSP
1.2000 10*6.[IU] | Freq: Once | INTRAMUSCULAR | Status: AC
  Administered 2013-12-06: 1.2 10*6.[IU] via INTRAMUSCULAR

## 2013-12-12 ENCOUNTER — Ambulatory Visit (INDEPENDENT_AMBULATORY_CARE_PROVIDER_SITE_OTHER): Payer: Self-pay | Admitting: *Deleted

## 2013-12-12 DIAGNOSIS — A539 Syphilis, unspecified: Secondary | ICD-10-CM

## 2013-12-12 MED ORDER — PENICILLIN G BENZATHINE 1200000 UNIT/2ML IM SUSP
1.2000 10*6.[IU] | Freq: Once | INTRAMUSCULAR | Status: AC
  Administered 2013-12-12: 1.2 10*6.[IU] via INTRAMUSCULAR

## 2013-12-13 ENCOUNTER — Ambulatory Visit: Payer: Self-pay

## 2014-01-18 ENCOUNTER — Encounter: Payer: Self-pay | Admitting: *Deleted

## 2014-01-21 ENCOUNTER — Ambulatory Visit (INDEPENDENT_AMBULATORY_CARE_PROVIDER_SITE_OTHER): Payer: Self-pay | Admitting: *Deleted

## 2014-01-21 VITALS — BP 121/82 | HR 79 | Temp 98.2°F | Resp 16 | Ht 71.0 in | Wt 211.5 lb

## 2014-01-21 DIAGNOSIS — B20 Human immunodeficiency virus [HIV] disease: Secondary | ICD-10-CM

## 2014-01-21 DIAGNOSIS — Z21 Asymptomatic human immunodeficiency virus [HIV] infection status: Secondary | ICD-10-CM

## 2014-01-21 LAB — COMPREHENSIVE METABOLIC PANEL
ALT: 24 U/L (ref 0–53)
AST: 22 U/L (ref 0–37)
Albumin: 4.3 g/dL (ref 3.5–5.2)
Alkaline Phosphatase: 36 U/L — ABNORMAL LOW (ref 39–117)
BUN: 18 mg/dL (ref 6–23)
CALCIUM: 9.9 mg/dL (ref 8.4–10.5)
CHLORIDE: 100 meq/L (ref 96–112)
CO2: 28 meq/L (ref 19–32)
Creat: 1.04 mg/dL (ref 0.50–1.35)
GLUCOSE: 87 mg/dL (ref 70–99)
Potassium: 4.1 mEq/L (ref 3.5–5.3)
SODIUM: 136 meq/L (ref 135–145)
TOTAL PROTEIN: 8.5 g/dL — AB (ref 6.0–8.3)
Total Bilirubin: 1.2 mg/dL (ref 0.2–1.2)

## 2014-01-21 LAB — LIPID PANEL
Cholesterol: 222 mg/dL — ABNORMAL HIGH (ref 0–200)
HDL: 50 mg/dL (ref >39)
LDL Cholesterol: 149 mg/dL — ABNORMAL HIGH (ref 0–99)
TRIGLYCERIDES: 113 mg/dL (ref <150)
Total CHOL/HDL Ratio: 4.4 Ratio
VLDL: 23 mg/dL (ref 0–40)
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
## 2014-01-22 LAB — URINALYSIS
Bilirubin Urine: NEGATIVE
GLUCOSE, UA: NEGATIVE mg/dL
KETONES UR: NEGATIVE mg/dL
LEUKOCYTES UA: NEGATIVE
NITRITE: NEGATIVE
Protein, ur: NEGATIVE mg/dL
Specific Gravity, Urine: 1.026 (ref 1.005–1.030)
Urobilinogen, UA: 0.2 mg/dL (ref 0.0–1.0)
pH: 6.5 (ref 5.0–8.0)

## 2014-01-22 LAB — HIV-1 RNA QUANT-NO REFLEX-BLD
HIV 1 RNA Quant: <20 copies/mL (ref <20)
HIV-1 RNA Quant, Log: <1.3 {Log} (ref <1.30)

## 2014-02-11 ENCOUNTER — Encounter: Payer: Self-pay | Admitting: Internal Medicine

## 2014-02-12 ENCOUNTER — Encounter: Payer: Self-pay | Admitting: Internal Medicine

## 2014-02-12 LAB — CD4/CD8 (T-HELPER/T-SUPPRESSOR CELL)
CD4%: 30.2
CD4: 513
CD8 % Suppressor T Cell: 49.8
CD8: 847

## 2014-02-15 ENCOUNTER — Encounter: Payer: Self-pay | Admitting: Internal Medicine

## 2014-04-01 ENCOUNTER — Encounter: Payer: Self-pay | Admitting: Internal Medicine

## 2014-04-29 ENCOUNTER — Ambulatory Visit: Payer: Self-pay

## 2014-05-02 ENCOUNTER — Other Ambulatory Visit: Payer: Self-pay | Admitting: *Deleted

## 2014-05-02 DIAGNOSIS — B20 Human immunodeficiency virus [HIV] disease: Secondary | ICD-10-CM

## 2014-05-02 MED ORDER — DOLUTEGRAVIR SODIUM 50 MG PO TABS: 50.0000 mg | ORAL_TABLET | Freq: Every day | ORAL | Status: DC

## 2014-05-02 MED ORDER — EMTRICITAB-RILPIVIR-TENOFOV DF 200-25-300 MG PO TABS
1.0000 | ORAL_TABLET | Freq: Every day | ORAL | Status: DC
Start: 2014-05-02 — End: 2019-06-25

## 2014-05-02 NOTE — Telephone Encounter (Signed)
ADAP Application 

## 2014-05-22 ENCOUNTER — Encounter: Payer: Self-pay | Admitting: *Deleted

## 2014-05-29 ENCOUNTER — Ambulatory Visit (INDEPENDENT_AMBULATORY_CARE_PROVIDER_SITE_OTHER): Payer: Self-pay | Admitting: *Deleted

## 2014-05-29 VITALS — BP 124/80 | HR 82 | Temp 98.0°F | Resp 17 | Wt 204.5 lb

## 2014-05-29 DIAGNOSIS — Z006 Encounter for examination for normal comparison and control in clinical research program: Secondary | ICD-10-CM

## 2014-05-29 DIAGNOSIS — B20 Human immunodeficiency virus [HIV] disease: Secondary | ICD-10-CM

## 2014-05-29 DIAGNOSIS — Z23 Encounter for immunization: Secondary | ICD-10-CM

## 2014-05-29 NOTE — Patient Instructions (Signed)
Administered flu vaccine

## 2014-05-29 NOTE — Progress Notes (Signed)
Alexander Hancock is here for START, month 40. He is adhering to his regimen and has no new complaints. Non fasting labs were drawn. He received $20 gift card for visit. Next appointment scheduled for Tuesday, Oct 29, 2014 @ 9am. Eliezer Champagne RN

## 2014-06-02 LAB — HIV-1 RNA QUANT-NO REFLEX-BLD: HIV 1 RNA Quant: <20 copies/mL (ref <20)

## 2014-06-14 ENCOUNTER — Ambulatory Visit (INDEPENDENT_AMBULATORY_CARE_PROVIDER_SITE_OTHER): Payer: Self-pay | Admitting: Infectious Diseases

## 2014-06-14 ENCOUNTER — Encounter: Payer: Self-pay | Admitting: Infectious Diseases

## 2014-06-14 ENCOUNTER — Telehealth: Payer: Self-pay | Admitting: *Deleted

## 2014-06-14 ENCOUNTER — Encounter: Payer: Self-pay | Admitting: Internal Medicine

## 2014-06-14 VITALS — BP 134/78 | HR 76 | Temp 98.3°F

## 2014-06-14 DIAGNOSIS — Z21 Asymptomatic human immunodeficiency virus [HIV] infection status: Secondary | ICD-10-CM

## 2014-06-14 DIAGNOSIS — E7800 Pure hypercholesterolemia, unspecified: Secondary | ICD-10-CM

## 2014-06-14 DIAGNOSIS — B20 Human immunodeficiency virus [HIV] disease: Secondary | ICD-10-CM

## 2014-06-14 DIAGNOSIS — K626 Ulcer of anus and rectum: Secondary | ICD-10-CM

## 2014-06-14 HISTORY — DX: Pure hypercholesterolemia, unspecified: E78.00

## 2014-06-14 LAB — CD4/CD8 (T-HELPER/T-SUPPRESSOR CELL)
CD4%: 38.5
CD4: 847
CD8 T CELL SUPPRESSOR: 39.5
CD8: 869

## 2014-06-14 NOTE — Assessment & Plan Note (Signed)
Will f/u with regular MD at next visit.

## 2014-06-14 NOTE — Assessment & Plan Note (Signed)
Please see HRA note.

## 2014-06-14 NOTE — Assessment & Plan Note (Signed)
He is taking meds well. He has gotten flu shot. Other vax uptodate.

## 2014-06-14 NOTE — Telephone Encounter (Signed)
Attempted to refer patient to [facility] and he does not currently have insurance; just [employer]. Offered to refer him to [facility] and he said he plans to get insurance through his employer soon and will let me know when he has it.

## 2014-06-14 NOTE — Progress Notes (Signed)
Patient ID: Alexander Hancock, male   DOB: 06/18/1991, 23 y.o.   MRN: [id number]  23 yo M with hx of HIV+, previously in START study. He was previously on ISN/TRV then changed to complera/DTGV due to adherence issues. Taking well, denies missed doses. GI upset has improved.   HIV 1 RNA Quant (copies/mL)  Date Value  05/29/2014 <20   01/21/2014 <20   10/04/2013 1576*     CD4 (no units)  Date Value  01/21/2014 513   10/04/2013 642   06/07/2013 807      CD4 T Cell Abs (cmm)  Date Value  12/01/2010 540     He comes to clinic today for HRA. He had previous "knot" above his anus, he thought it was related to his previous syphillis. He has had occas bleeding from this.  No f/c. Normal BM. Has occas blood on outside of BM occasionally.   Filed Vitals:   06/14/14 1002  BP: 134/78  Pulse: 76  Temp: 98.3 F (36.8 C)       PRE-OPERATIVE DIAGNOSIS:  Anal ulcer, HRA with bx  POST-OPERATIVE DIAGNOSIS:  Anal warts with thrombosed external hemmoroid, HRA with bx  PROCEDURE:    SURGEON:  Hatcher  ASSISTANT: [place]  ANESTHESIA:   local  EBL: < 10 cc   SPECIMEN:  No Specimen  DISPOSITION OF SPECIMEN:  N/A  COUNTS:  YES  PLAN OF CARE: home  PATIENT DISPOSITION:  home  INDICATION: anal bleeding   OR FINDINGS: large posterior thrombosed hemorrhoid, 3 external warts at 12 o'clock, no internal lesions.    DESCRIPTION: The patient was identified in the waiting area and taken to the exam room where they were laid on the table in the lateral decubitus position.  The patient was then prepped and draped in the usual fashion. A surgical timeout was performed indicating the correct patient, procedure, positioning and preoperative antibioitics.   After this was completed, a sponge was soaked in 2% acetic acid was placed over the perianal region. This was allowed to soak for 1 minute. The sponge was removed and the perianal region was evaluated with a colposcope.  There were 3 anterior  external warts, a large posterior thrombosed hemorrhoid.  The internal anal canal was evaluated via anoscopy with an anoscope.  There were no suspicious internal lesions. After this was completed, hemostasis was achieved with gauze. There was minimal bleeding from one of the warts.     PLAN:   Will have him seen by [facility] to see if he can hemorrhoid removed.

## 2014-08-23 ENCOUNTER — Encounter: Payer: Self-pay | Admitting: Internal Medicine

## 2014-08-27 ENCOUNTER — Encounter: Payer: Self-pay | Admitting: Internal Medicine

## 2014-11-25 ENCOUNTER — Ambulatory Visit (INDEPENDENT_AMBULATORY_CARE_PROVIDER_SITE_OTHER): Payer: Self-pay | Admitting: *Deleted

## 2014-11-25 VITALS — BP 105/68 | HR 67 | Temp 98.1°F | Resp 16 | Wt 201.0 lb

## 2014-11-25 DIAGNOSIS — Z006 Encounter for examination for normal comparison and control in clinical research program: Secondary | ICD-10-CM

## 2014-11-25 NOTE — Progress Notes (Signed)
Alexander Hancock is here for his 28 Month START study visit. The protocol just went in to version 3 and visits will now be every 6 months. He was informed of the changes and signed new consents for the study. He currently denies any problems. He is working out of [place] now and will probably be moving there before long. He still plans to come here for his HIV care. He will return in September for the next study visit.

## 2014-11-26 LAB — COMPREHENSIVE METABOLIC PANEL
ALT: 14 U/L (ref 0–53)
AST: 15 U/L (ref 0–37)
Albumin: 4.4 g/dL (ref 3.5–5.2)
Alkaline Phosphatase: 45 U/L (ref 39–117)
BUN: 16 mg/dL (ref 6–23)
CALCIUM: 9.5 mg/dL (ref 8.4–10.5)
CO2: 24 mEq/L (ref 19–32)
Chloride: 102 mEq/L (ref 96–112)
Creat: 0.98 mg/dL (ref 0.50–1.35)
GLUCOSE: 82 mg/dL (ref 70–99)
Potassium: 4.2 mEq/L (ref 3.5–5.3)
SODIUM: 141 meq/L (ref 135–145)
TOTAL PROTEIN: 7.6 g/dL (ref 6.0–8.3)
Total Bilirubin: 0.6 mg/dL (ref 0.2–1.2)

## 2014-11-26 LAB — POCT URINALYSIS DIPSTICK
Bilirubin, UA: NEGATIVE
Glucose, UA: NEGATIVE
KETONES UA: NEGATIVE
Leukocytes, UA: NEGATIVE
Nitrite, UA: NEGATIVE
Protein, UA: NEGATIVE
SPEC GRAV UA: 1.015
UROBILINOGEN UA: 0.2
pH, UA: 7

## 2014-11-26 LAB — LIPID PANEL
Cholesterol: 174 mg/dL (ref 0–200)
HDL: 43 mg/dL (ref >=40)
LDL Cholesterol: 99 mg/dL (ref 0–99)
Total CHOL/HDL Ratio: 4 Ratio
Triglycerides: 158 mg/dL — ABNORMAL HIGH (ref <150)
VLDL: 32 mg/dL (ref 0–40)

## 2014-11-27 LAB — HIV-1 RNA QUANT-NO REFLEX-BLD

## 2014-12-04 ENCOUNTER — Encounter: Payer: Self-pay | Admitting: Internal Medicine

## 2014-12-04 LAB — CD4/CD8 (T-HELPER/T-SUPPRESSOR CELL)
CD4%: 39.9
CD4: 758
CD8 % Suppressor T Cell: 41.7
CD8: 792

## 2015-01-08 ENCOUNTER — Telehealth: Payer: Self-pay | Admitting: *Deleted

## 2015-01-08 ENCOUNTER — Other Ambulatory Visit: Payer: Self-pay | Admitting: Internal Medicine

## 2015-01-08 NOTE — Telephone Encounter (Signed)
Alexander Hancock called and said he left all of his study meds in [place], 4 months of Tivicay and Complera. He was asking if he could get refills of them from [facility]. I told him we would have to make sure he had his ADAP renewed which it hasn't been, so he would need to come in and do the financial assessment first. I told him if there was anyway he could retrieve them from [place] to please do that and that they are very expensive to replace. He is supposed to call me back if he has trouble getting them. We may be able to have them replaced on study, but only 1 time. I did tell him he would be all right off his meds for a few days.

## 2015-01-08 NOTE — Telephone Encounter (Signed)
Pt called to let K. Epperson know that he lost his medication.  He had already called [facility] about a refill.  This message is attached to that refill request.

## 2015-06-10 ENCOUNTER — Ambulatory Visit (INDEPENDENT_AMBULATORY_CARE_PROVIDER_SITE_OTHER): Payer: Self-pay | Admitting: *Deleted

## 2015-06-10 ENCOUNTER — Encounter (INDEPENDENT_AMBULATORY_CARE_PROVIDER_SITE_OTHER): Payer: Self-pay | Admitting: *Deleted

## 2015-06-10 VITALS — BP 130/88 | HR 81 | Temp 98.1°F | Resp 17 | Wt 219.0 lb

## 2015-06-10 DIAGNOSIS — A530 Latent syphilis, unspecified as early or late: Secondary | ICD-10-CM

## 2015-06-10 DIAGNOSIS — Z006 Encounter for examination for normal comparison and control in clinical research program: Secondary | ICD-10-CM

## 2015-06-10 DIAGNOSIS — Z23 Encounter for immunization: Secondary | ICD-10-CM

## 2015-06-10 DIAGNOSIS — A539 Syphilis, unspecified: Secondary | ICD-10-CM

## 2015-06-10 DIAGNOSIS — Z0000 Encounter for general adult medical examination without abnormal findings: Secondary | ICD-10-CM

## 2015-06-10 NOTE — Patient Instructions (Signed)
Flu vaccine administration
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
## 2015-06-11 LAB — HIV-1 RNA QUANT-NO REFLEX-BLD

## 2015-06-11 LAB — FLUORESCENT TREPONEMAL AB(FTA)-IGG-BLD: FLUORESCENT TREPONEMAL ABS: REACTIVE — AB

## 2015-06-11 LAB — RPR TITER: RPR Titer: 1:4 {titer}

## 2015-06-11 LAB — RPR: RPR: REACTIVE — AB

## 2015-06-18 ENCOUNTER — Encounter: Payer: Self-pay | Admitting: Internal Medicine

## 2015-06-18 LAB — CD4/CD8 (T-HELPER/T-SUPPRESSOR CELL)
CD4%: 35.7
CD4: 857
CD8 % Suppressor T Cell: 42.7
CD8: 1025

## 2016-04-13 ENCOUNTER — Encounter: Payer: Self-pay | Admitting: *Deleted

## 2016-06-01 ENCOUNTER — Encounter (HOSPITAL_COMMUNITY): Payer: Self-pay | Admitting: Emergency Medicine

## 2016-06-01 ENCOUNTER — Emergency Department (HOSPITAL_COMMUNITY)
Admission: EM | Admit: 2016-06-01 | Discharge: 2016-06-01 | Disposition: A | Discharge status: Home / Self Care | Payer: Self-pay | Attending: Emergency Medicine | Admitting: Emergency Medicine

## 2016-06-01 ENCOUNTER — Emergency Department (HOSPITAL_COMMUNITY): Payer: Self-pay

## 2016-06-01 DIAGNOSIS — R0602 Shortness of breath: Secondary | ICD-10-CM | POA: Insufficient documentation

## 2016-06-01 DIAGNOSIS — R079 Chest pain, unspecified: Secondary | ICD-10-CM | POA: Insufficient documentation

## 2016-06-01 DIAGNOSIS — Z5321 Procedure and treatment not carried out due to patient leaving prior to being seen by health care provider: Secondary | ICD-10-CM | POA: Insufficient documentation

## 2016-06-01 LAB — CBC
HEMATOCRIT: 44.5 % (ref 39.0–52.0)
HEMOGLOBIN: 15.2 g/dL (ref 13.0–17.0)
MCH: 30.6 pg (ref 26.0–34.0)
MCHC: 34.2 g/dL (ref 30.0–36.0)
MCV: 89.5 fL (ref 78.0–100.0)
Platelets: 151 10*3/uL (ref 150–400)
RBC: 4.97 MIL/uL (ref 4.22–5.81)
RDW: 13.4 % (ref 11.5–15.5)
WBC: 7 10*3/uL (ref 4.0–10.5)

## 2016-06-01 LAB — BASIC METABOLIC PANEL
ANION GAP: 10 (ref 5–15)
BUN: 10 mg/dL (ref 6–20)
CHLORIDE: 101 mmol/L (ref 101–111)
CO2: 25 mmol/L (ref 22–32)
Calcium: 9.5 mg/dL (ref 8.9–10.3)
Creatinine, Ser: 1.17 mg/dL (ref 0.61–1.24)
GFR calc Af Amer: >60 mL/min (ref >60)
GLUCOSE: 110 mg/dL — AB (ref 65–99)
POTASSIUM: 3.6 mmol/L (ref 3.5–5.1)
Sodium: 136 mmol/L (ref 135–145)

## 2016-06-01 LAB — I-STAT TROPONIN, ED: Troponin i, poc: 0 ng/mL (ref 0.00–0.08)

## 2016-06-01 MED ORDER — ACETAMINOPHEN 500 MG PO TABS
1000.0000 mg | ORAL_TABLET | Freq: Once | ORAL | Status: AC
  Administered 2016-06-01: 1000 mg via ORAL

## 2016-06-01 MED ORDER — ACETAMINOPHEN 500 MG PO TABS
ORAL_TABLET | ORAL | Status: AC
  Filled 2016-06-01: qty 2

## 2016-06-01 NOTE — ED Notes (Signed)
Patient decided to leave after stating he felt like he was gonna passout.Marland KitchenMarland KitchenTried to convince patient to stay patient stated that if he wasn't going to see a Dr. In 42mins  He was going to leave... Patient walked out stated he wasn't going to wait anymore.Marland KitchenMarland Kitchen

## 2016-06-01 NOTE — ED Triage Notes (Signed)
Patient here because he thinks he has PNA. States that he has chest pain and shortness of breath when he takes a deep breath.  States that he has been having chills with this. Started 2 hours ago.

## 2016-06-22 ENCOUNTER — Encounter (INDEPENDENT_AMBULATORY_CARE_PROVIDER_SITE_OTHER): Payer: Self-pay | Admitting: *Deleted

## 2016-06-22 VITALS — BP 137/87 | HR 81 | Temp 98.3°F | Wt 245.5 lb

## 2016-06-22 DIAGNOSIS — Z006 Encounter for examination for normal comparison and control in clinical research program: Secondary | ICD-10-CM

## 2016-06-22 LAB — COMPREHENSIVE METABOLIC PANEL
ALBUMIN: 4.2 g/dL (ref 3.6–5.1)
ALT: 15 U/L (ref 9–46)
AST: 13 U/L (ref 10–40)
Alkaline Phosphatase: 52 U/L (ref 40–115)
BUN: 16 mg/dL (ref 7–25)
CHLORIDE: 104 mmol/L (ref 98–110)
CO2: 24 mmol/L (ref 20–31)
CREATININE: 1.14 mg/dL (ref 0.60–1.35)
Calcium: 9.5 mg/dL (ref 8.6–10.3)
Glucose, Bld: 98 mg/dL (ref 65–99)
POTASSIUM: 3.8 mmol/L (ref 3.5–5.3)
SODIUM: 139 mmol/L (ref 135–146)
Total Bilirubin: 0.7 mg/dL (ref 0.2–1.2)
Total Protein: 7.2 g/dL (ref 6.1–8.1)

## 2016-06-22 LAB — LIPID PANEL
CHOL/HDL RATIO: 6.3 ratio — AB (ref <=5.0)
Cholesterol: 182 mg/dL (ref 125–200)
HDL: 29 mg/dL — AB (ref >=40)
LDL CALC: 106 mg/dL (ref <130)
TRIGLYCERIDES: 234 mg/dL — AB (ref <150)
VLDL: 47 mg/dL — AB (ref <30)

## 2016-06-22 LAB — POCT URINALYSIS DIPSTICK
BILIRUBIN UA: NEGATIVE
Glucose, UA: NEGATIVE
KETONES UA: NEGATIVE
Leukocytes, UA: NEGATIVE
NITRITE UA: NEGATIVE
PH UA: 6
Protein, UA: NEGATIVE
Spec Grav, UA: 1.025
Urobilinogen, UA: 0.2

## 2016-06-22 NOTE — Progress Notes (Signed)
Alexander Hancock is here for his final visit for START study. He has not been here in over a year. He ran out of study meds a few days ago and we will work on getting him signed up with Alexander Hancock and ADAP since he no longer has insurance. He says he is taking online classes now working on a degree in business administration. He denies any new problems or medications. I have scheduled him an appt. With Dr. Tommy Medal next week and he will need his flushot at that time.

## 2016-06-23 ENCOUNTER — Ambulatory Visit: Payer: Self-pay

## 2016-06-24 LAB — HIV-1 RNA QUANT-NO REFLEX-BLD

## 2016-06-25 ENCOUNTER — Ambulatory Visit: Payer: Self-pay

## 2016-06-30 ENCOUNTER — Ambulatory Visit: Payer: Self-pay | Admitting: Infectious Disease

## 2016-07-02 ENCOUNTER — Encounter: Payer: Self-pay | Admitting: *Deleted

## 2016-07-02 LAB — CD4/CD8 (T-HELPER/T-SUPPRESSOR CELL)
CD4 absolute: 996
CD4%: 36.9
CD8 % Suppressor T Cell: 41.4
CD8 T Cell Abs: 1118

## 2017-01-26 IMAGING — CR DG CHEST 2V
2 series · 2 of 2 positions shown · non-contrast
Comparison: 09/27/2009

CLINICAL DATA: 25 y/o  M; chest pain and shortness of breath today.

EXAM:
CHEST  2 VIEW

[chest pa]
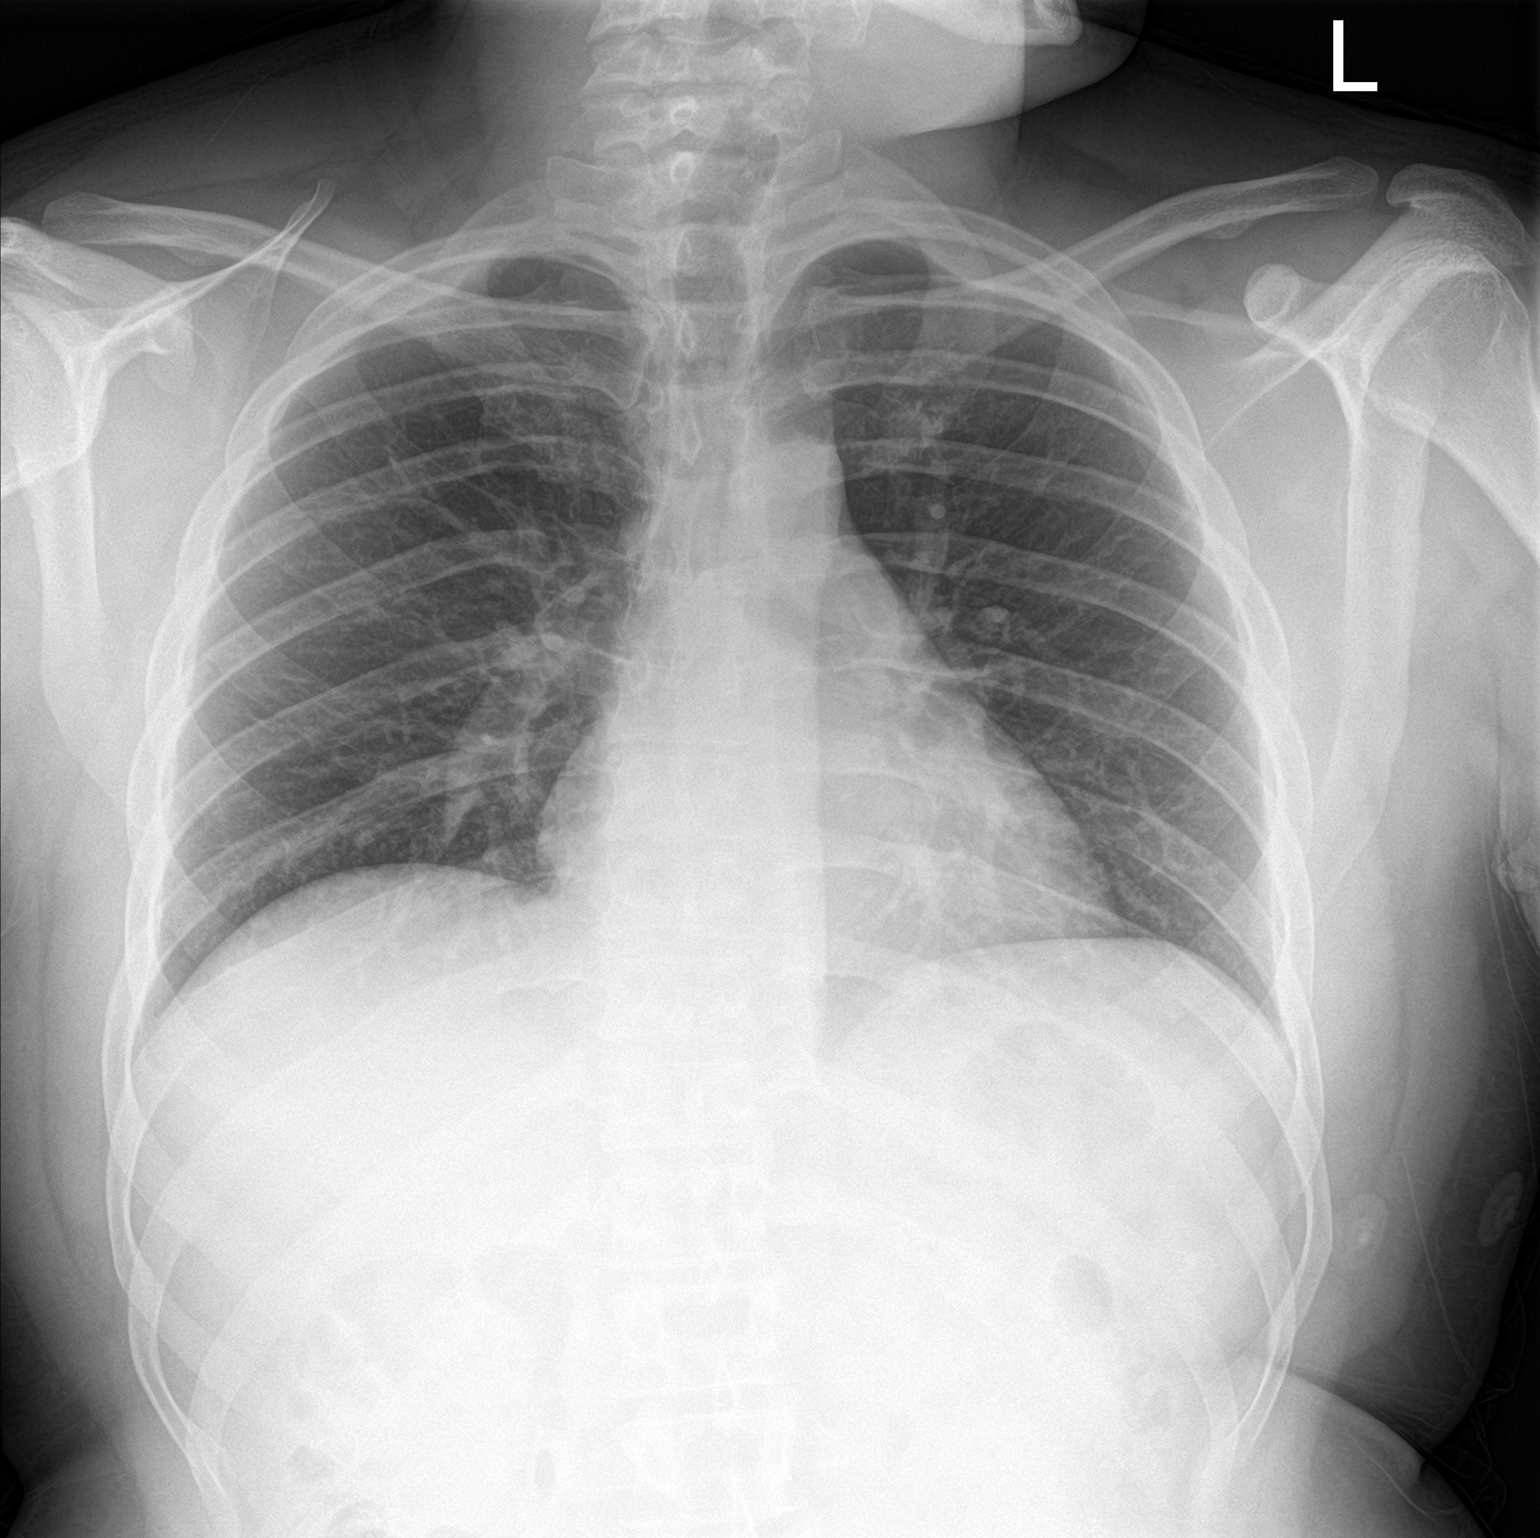

[chest lat]
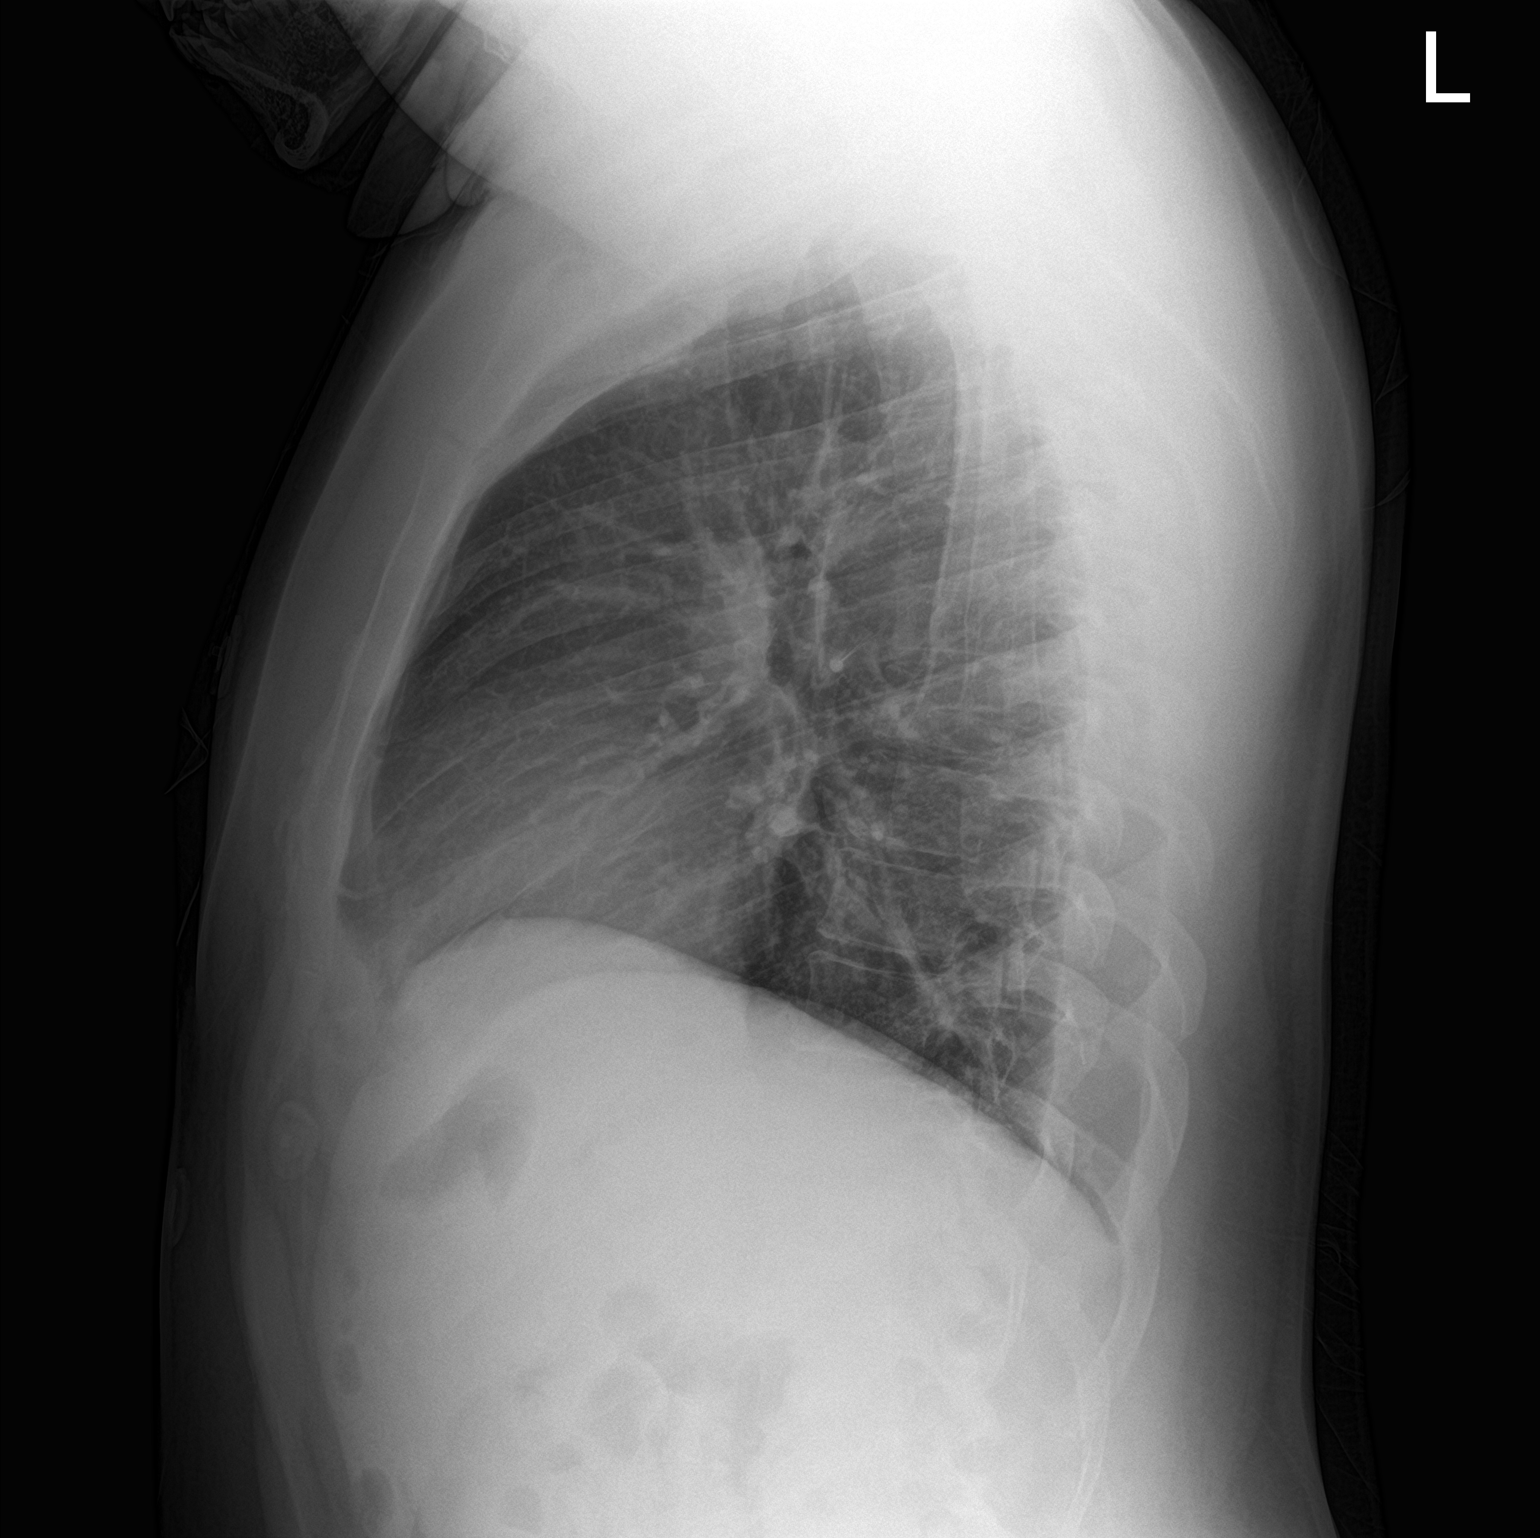

[2 of 2 positions shown; findings below may reference images not displayed]

FINDINGS: Stable cardiomediastinal silhouette within normal limits. Low lung
volumes. Clear lungs. No pleural effusion or pneumothorax. No acute
osseous abnormality.
IMPRESSION: No active cardiopulmonary disease.

By: Kahei Pedregosa M.D.

## 2017-10-19 ENCOUNTER — Emergency Department (HOSPITAL_COMMUNITY)
Admission: EM | Admit: 2017-10-19 | Discharge: 2017-10-19 | Disposition: A | Discharge status: Home / Self Care | Payer: Self-pay | Attending: Emergency Medicine | Admitting: Emergency Medicine

## 2017-10-19 ENCOUNTER — Other Ambulatory Visit: Payer: Self-pay

## 2017-10-19 ENCOUNTER — Encounter (HOSPITAL_COMMUNITY): Payer: Self-pay | Admitting: *Deleted

## 2017-10-19 DIAGNOSIS — Z21 Asymptomatic human immunodeficiency virus [HIV] infection status: Secondary | ICD-10-CM | POA: Insufficient documentation

## 2017-10-19 DIAGNOSIS — M545 Low back pain, unspecified: Secondary | ICD-10-CM

## 2017-10-19 DIAGNOSIS — Z79899 Other long term (current) drug therapy: Secondary | ICD-10-CM | POA: Insufficient documentation

## 2017-10-19 MED ORDER — CYCLOBENZAPRINE HCL 10 MG PO TABS: 10.0000 mg | ORAL_TABLET | Freq: Two times a day (BID) | ORAL | 0 refills | Status: DC | PRN
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
## 2017-10-19 NOTE — Discharge Instructions (Signed)
Please read attached information. If you experience any new or worsening signs or symptoms please return to the emergency room for evaluation. Please follow-up with your primary care provider or specialist as discussed. Please use medication prescribed only as directed and discontinue taking if you have any concerning signs or symptoms.   °
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
## 2019-06-06 ENCOUNTER — Ambulatory Visit: Payer: Self-pay

## 2019-06-06 ENCOUNTER — Encounter: Payer: Self-pay | Admitting: Internal Medicine

## 2019-06-06 ENCOUNTER — Other Ambulatory Visit: Payer: Self-pay

## 2019-06-06 DIAGNOSIS — Z21 Asymptomatic human immunodeficiency virus [HIV] infection status: Secondary | ICD-10-CM

## 2019-06-06 DIAGNOSIS — Z79899 Other long term (current) drug therapy: Secondary | ICD-10-CM

## 2019-06-06 DIAGNOSIS — B20 Human immunodeficiency virus [HIV] disease: Secondary | ICD-10-CM

## 2019-06-06 DIAGNOSIS — Z113 Encounter for screening for infections with a predominantly sexual mode of transmission: Secondary | ICD-10-CM

## 2019-06-06 NOTE — Addendum Note (Signed)
Addended byMeriel Pica F on: 06/06/2019 02:23 PM   Modules accepted: Orders

## 2019-06-06 NOTE — Addendum Note (Signed)
Addended byMeriel Pica F on: 06/06/2019 02:25 PM   Modules accepted: Orders

## 2019-06-07 LAB — URINE CYTOLOGY ANCILLARY ONLY
Chlamydia: NEGATIVE
Molecular Disclaimer: NEGATIVE
Molecular Disclaimer: NORMAL
Neisseria Gonorrhea: NEGATIVE

## 2019-06-07 LAB — T-HELPER CELL (CD4) - (RCID CLINIC ONLY)
CD4 % Helper T Cell: 34 % (ref 33–65)
CD4 T Cell Abs: 892 /uL (ref 400–1790)

## 2019-06-07 LAB — URINALYSIS
Bilirubin Urine: NEGATIVE
Glucose, UA: NEGATIVE
Hgb urine dipstick: NEGATIVE
Ketones, ur: NEGATIVE
Leukocytes,Ua: NEGATIVE
Nitrite: NEGATIVE
Protein, ur: NEGATIVE
Specific Gravity, Urine: 1.026 (ref 1.001–1.03)
pH: 5.5 (ref 5.0–8.0)

## 2019-06-12 LAB — CBC WITH DIFFERENTIAL/PLATELET
Absolute Monocytes: 286 cells/uL (ref 200–950)
Basophils Absolute: 22 cells/uL (ref 0–200)
Basophils Relative: 0.4 %
Eosinophils Absolute: 81 cells/uL (ref 15–500)
Eosinophils Relative: 1.5 %
HCT: 41.4 % (ref 38.5–50.0)
Hemoglobin: 14.1 g/dL (ref 13.2–17.1)
Lymphs Abs: 2743 cells/uL (ref 850–3900)
MCH: 29.3 pg (ref 27.0–33.0)
MCHC: 34.1 g/dL (ref 32.0–36.0)
MCV: 86.1 fL (ref 80.0–100.0)
MPV: 11 fL (ref 7.5–12.5)
Monocytes Relative: 5.3 %
Neutro Abs: 2268 cells/uL (ref 1500–7800)
Neutrophils Relative %: 42 %
Platelets: 231 10*3/uL (ref 140–400)
RBC: 4.81 10*6/uL (ref 4.20–5.80)
RDW: 14.7 % (ref 11.0–15.0)
Total Lymphocyte: 50.8 %
WBC: 5.4 10*3/uL (ref 3.8–10.8)

## 2019-06-12 LAB — COMPLETE METABOLIC PANEL WITH GFR
AG Ratio: 1.3 (calc) (ref 1.0–2.5)
ALT: 25 U/L (ref 9–46)
AST: 20 U/L (ref 10–40)
Albumin: 4.4 g/dL (ref 3.6–5.1)
Alkaline phosphatase (APISO): 44 U/L (ref 36–130)
BUN: 16 mg/dL (ref 7–25)
CO2: 27 mmol/L (ref 20–32)
Calcium: 9.8 mg/dL (ref 8.6–10.3)
Chloride: 103 mmol/L (ref 98–110)
Creat: 1.01 mg/dL (ref 0.60–1.35)
GFR, Est African American: 117 mL/min/{1.73_m2} (ref >=60)
GFR, Est Non African American: 101 mL/min/{1.73_m2} (ref >=60)
Globulin: 3.4 g/dL (calc) (ref 1.9–3.7)
Glucose, Bld: 95 mg/dL (ref 65–99)
Potassium: 4.2 mmol/L (ref 3.5–5.3)
Sodium: 137 mmol/L (ref 135–146)
Total Bilirubin: 0.8 mg/dL (ref 0.2–1.2)
Total Protein: 7.8 g/dL (ref 6.1–8.1)

## 2019-06-12 LAB — FLUORESCENT TREPONEMAL AB(FTA)-IGG-BLD: Fluorescent Treponemal ABS: REACTIVE — AB

## 2019-06-12 LAB — LIPID PANEL
Cholesterol: 222 mg/dL — ABNORMAL HIGH (ref <200)
HDL: 37 mg/dL — ABNORMAL LOW (ref >=40)
LDL Cholesterol (Calc): 142 mg/dL (calc) — ABNORMAL HIGH
Non-HDL Cholesterol (Calc): 185 mg/dL (calc) — ABNORMAL HIGH (ref <130)
Total CHOL/HDL Ratio: 6 (calc) — ABNORMAL HIGH (ref <5.0)
Triglycerides: 281 mg/dL — ABNORMAL HIGH (ref <150)

## 2019-06-12 LAB — HIV-1 RNA ULTRAQUANT REFLEX TO GENTYP+
HIV 1 RNA Quant: <20 copies/mL
HIV-1 RNA Quant, Log: <1.3 Log copies/mL

## 2019-06-12 LAB — RPR TITER: RPR Titer: 1:4 {titer} — ABNORMAL HIGH

## 2019-06-12 LAB — RPR: RPR Ser Ql: REACTIVE — AB

## 2019-06-21 ENCOUNTER — Encounter: Payer: Self-pay | Admitting: Internal Medicine

## 2019-06-21 ENCOUNTER — Ambulatory Visit: Payer: Self-pay | Admitting: Pharmacist

## 2019-06-25 ENCOUNTER — Encounter: Payer: Self-pay | Admitting: Infectious Diseases

## 2019-06-25 ENCOUNTER — Telehealth: Payer: Self-pay | Admitting: Pharmacy Technician

## 2019-06-25 ENCOUNTER — Ambulatory Visit (INDEPENDENT_AMBULATORY_CARE_PROVIDER_SITE_OTHER): Payer: Self-pay | Admitting: Infectious Diseases

## 2019-06-25 ENCOUNTER — Ambulatory Visit (INDEPENDENT_AMBULATORY_CARE_PROVIDER_SITE_OTHER): Payer: Self-pay | Admitting: Pharmacist

## 2019-06-25 ENCOUNTER — Other Ambulatory Visit: Payer: Self-pay

## 2019-06-25 DIAGNOSIS — B20 Human immunodeficiency virus [HIV] disease: Secondary | ICD-10-CM

## 2019-06-25 DIAGNOSIS — Z21 Asymptomatic human immunodeficiency virus [HIV] infection status: Secondary | ICD-10-CM

## 2019-06-25 DIAGNOSIS — A630 Anogenital (venereal) warts: Secondary | ICD-10-CM

## 2019-06-25 DIAGNOSIS — E7800 Pure hypercholesterolemia, unspecified: Secondary | ICD-10-CM

## 2019-06-25 DIAGNOSIS — A539 Syphilis, unspecified: Secondary | ICD-10-CM

## 2019-06-25 MED ORDER — JULUCA 50-25 MG PO TABS: 1.0000 | ORAL_TABLET | Freq: Every day | ORAL | 3 refills | Status: DC

## 2019-06-25 NOTE — Progress Notes (Signed)
   Subjective:    Patient ID: Alexander Hancock, male    DOB: 1990/11/02, 28 y.o.   MRN: [id number]  HPI 28 yo M  hx of HIV+, previously in START study. He was previously on ISN/TRV then changed to complera/tivicay due to adherence issues He has also been seen for HRA, hemmeroids/external warts, was sent to [facility].He was seen by [facility] and was to see "specialist". Has had no further f/u. No new lesions noted by pt.  He has not been seen in ID for several years. Was living in [place].   Was taking complera-tivicay sparingly (3-4days/wk).  Has been feeling well.   The past medical history, family history and social history were reviewed/updated in EPIC   Review of Systems  Constitutional: Negative for appetite change, chills, fever and unexpected weight change.  Respiratory: Negative for cough and shortness of breath.   Gastrointestinal: Negative for anal bleeding, constipation and diarrhea.  Genitourinary: Negative for difficulty urinating.  Psychiatric/Behavioral: Negative for sleep disturbance.  joined gym recently. Goal is to lose 90#.  Please see HPI. All other systems reviewed and negative.     Objective:   Physical Exam Constitutional:      Appearance: Normal appearance.  HENT:     Mouth/Throat:     Mouth: Mucous membranes are moist.     Pharynx: No oropharyngeal exudate.  Eyes:     Extraocular Movements: Extraocular movements intact.     Pupils: Pupils are equal, round, and reactive to light.  Neck:     Musculoskeletal: Normal range of motion and neck supple.  Cardiovascular:     Rate and Rhythm: Normal rate and regular rhythm.  Pulmonary:     Effort: Pulmonary effort is normal.     Breath sounds: Normal breath sounds.  Abdominal:     General: Bowel sounds are normal. There is no distension.     Palpations: Abdomen is soft.     Tenderness: There is no abdominal tenderness.  Musculoskeletal:     Right lower leg: No edema.     Left lower leg: No edema.  Neurological:      General: No focal deficit present.     Mental Status: He is alert.  Psychiatric:        Mood and Affect: Mood normal.       Assessment & Plan:

## 2019-06-25 NOTE — Assessment & Plan Note (Signed)
Will change him to juluca.  He is interested in cabo-rilpivirine when available.  Offered/refused condoms.  adap recently renewed.  PCV 13 at f/u Refuses flu rtc in 3-4 months.

## 2019-06-25 NOTE — Assessment & Plan Note (Signed)
Hopefully will improve with diet and exercise.  Will repeat at f/u.

## 2019-06-25 NOTE — Progress Notes (Signed)
Patient doing well and did not need to see pharmacy today.

## 2019-06-25 NOTE — Assessment & Plan Note (Signed)
Will continue to watch his RPR.

## 2019-06-25 NOTE — Assessment & Plan Note (Signed)
Will get him back in with [facility]

## 2019-06-25 NOTE — Telephone Encounter (Addendum)
[facility] Patient Teacher, English as a foreign language completed.    The patient is uninsured and will need patient assistance for medication.  He has applied for HMAP and will be approved any day now.  We can complete the application and will need to meet with the patient for signatures and income documentation.  Alexander Hancock. [name] [place] Patient [facility] Phone: [phone] Fax:  [phone]

## 2019-06-28 ENCOUNTER — Inpatient Hospital Stay: Admission: RE | Admit: 2019-06-28 | Payer: Self-pay | Source: Ambulatory Visit

## 2019-09-11 ENCOUNTER — Other Ambulatory Visit: Payer: Self-pay

## 2019-09-25 ENCOUNTER — Encounter: Payer: Self-pay | Admitting: Infectious Diseases

## 2019-09-28 ENCOUNTER — Encounter: Payer: Self-pay | Admitting: Infectious Diseases

## 2019-12-14 ENCOUNTER — Telehealth: Payer: Self-pay | Admitting: Pharmacy Technician

## 2019-12-14 ENCOUNTER — Encounter: Payer: Self-pay | Admitting: Pharmacy Technician

## 2019-12-14 NOTE — Telephone Encounter (Signed)
[facility] Patient Advocate Encounter   Was successful in obtaining a ViiV copay card for Alexander Hancock. This copay card will make the patients copay $0.  I have spoken with the patient and he will pick up today at [facility].    The billing information is RxBin: [id number] PCN: Loyalty Member ID: [id number] Group ID: [id number]  Alexander Hancock, Alexander Hancock Patient [facility] Phone: [phone] Fax: [phone] 12/14/2019 2:33 PM

## 2019-12-17 ENCOUNTER — Ambulatory Visit: Payer: BC Managed Care – PPO

## 2019-12-17 ENCOUNTER — Other Ambulatory Visit: Payer: Self-pay

## 2019-12-17 ENCOUNTER — Other Ambulatory Visit: Payer: BC Managed Care – PPO

## 2019-12-17 DIAGNOSIS — B20 Human immunodeficiency virus [HIV] disease: Secondary | ICD-10-CM

## 2019-12-17 DIAGNOSIS — E7800 Pure hypercholesterolemia, unspecified: Secondary | ICD-10-CM

## 2019-12-18 LAB — T-HELPER CELL (CD4) - (RCID CLINIC ONLY)
CD4 % Helper T Cell: 36 % (ref 33–65)
CD4 T Cell Abs: 958 /uL (ref 400–1790)

## 2019-12-19 LAB — LIPID PANEL
Cholesterol: 215 mg/dL — ABNORMAL HIGH (ref <200)
HDL: 37 mg/dL — ABNORMAL LOW (ref >=40)
Non-HDL Cholesterol (Calc): 178 mg/dL (calc) — ABNORMAL HIGH (ref <130)
Total CHOL/HDL Ratio: 5.8 (calc) — ABNORMAL HIGH (ref <5.0)
Triglycerides: 489 mg/dL — ABNORMAL HIGH (ref <150)

## 2019-12-19 LAB — CBC
HCT: 43.6 % (ref 38.5–50.0)
Hemoglobin: 14.4 g/dL (ref 13.2–17.1)
MCH: 28.2 pg (ref 27.0–33.0)
MCHC: 33 g/dL (ref 32.0–36.0)
MCV: 85.5 fL (ref 80.0–100.0)
MPV: 11 fL (ref 7.5–12.5)
Platelets: 214 10*3/uL (ref 140–400)
RBC: 5.1 10*6/uL (ref 4.20–5.80)
RDW: 14.5 % (ref 11.0–15.0)
WBC: 6.2 10*3/uL (ref 3.8–10.8)

## 2019-12-19 LAB — COMPLETE METABOLIC PANEL WITH GFR
AG Ratio: 1.3 (calc) (ref 1.0–2.5)
ALT: 18 U/L (ref 9–46)
AST: 14 U/L (ref 10–40)
Albumin: 4.3 g/dL (ref 3.6–5.1)
Alkaline phosphatase (APISO): 53 U/L (ref 36–130)
BUN: 12 mg/dL (ref 7–25)
CO2: 31 mmol/L (ref 20–32)
Calcium: 9.7 mg/dL (ref 8.6–10.3)
Chloride: 101 mmol/L (ref 98–110)
Creat: 1.01 mg/dL (ref 0.60–1.35)
GFR, Est African American: 116 mL/min/{1.73_m2} (ref >=60)
GFR, Est Non African American: 100 mL/min/{1.73_m2} (ref >=60)
Globulin: 3.4 g/dL (calc) (ref 1.9–3.7)
Glucose, Bld: 90 mg/dL (ref 65–99)
Potassium: 4.2 mmol/L (ref 3.5–5.3)
Sodium: 137 mmol/L (ref 135–146)
Total Bilirubin: 0.8 mg/dL (ref 0.2–1.2)
Total Protein: 7.7 g/dL (ref 6.1–8.1)

## 2019-12-19 LAB — HIV-1 RNA QUANT-NO REFLEX-BLD
HIV 1 RNA Quant: <20 copies/mL
HIV-1 RNA Quant, Log: <1.3 Log copies/mL

## 2020-01-01 ENCOUNTER — Encounter: Payer: BC Managed Care – PPO | Admitting: Infectious Diseases

## 2020-06-21 ENCOUNTER — Ambulatory Visit: Payer: Self-pay

## 2020-08-18 ENCOUNTER — Telehealth: Payer: Self-pay

## 2020-08-18 ENCOUNTER — Other Ambulatory Visit: Payer: Self-pay | Admitting: Infectious Diseases

## 2020-08-18 DIAGNOSIS — B20 Human immunodeficiency virus [HIV] disease: Secondary | ICD-10-CM

## 2020-08-18 NOTE — Telephone Encounter (Signed)
Patient called, he has moved to [place] and has one week of medication left. He has not found a new provider yet, he is requesting refills. Has not been seen in our office for over a year. RN informed patient to find new provider in [place], sign a release of information form and call us once he has established care and that we will be happy to help bridge him to his new provider. Patient states he will look for a new provider today and tomorrow.   Beryle Flock, RN

## 2020-08-19 NOTE — Telephone Encounter (Signed)
awaiting new appointment with ID

## 2020-08-19 NOTE — Telephone Encounter (Signed)
Patient states he has found an ID clinic that he would like to transfer to. Per patient this clinic requires a referral prior to scheduling an appointment. Routing to provider for referral.  Eugenia Mcalpine

## 2020-08-21 NOTE — Telephone Encounter (Signed)
Thanks, I'll call them

## 2020-08-26 NOTE — Telephone Encounter (Signed)
Called, left VM, awaiting call back

## 2020-09-04 NOTE — Telephone Encounter (Signed)
Follow up call placed to referral coordinator Brayton Caves at ext [phone] left voicemail.

## 2021-06-30 ENCOUNTER — Telehealth: Payer: Self-pay

## 2021-06-30 NOTE — Telephone Encounter (Signed)
Thank you so much for the heads up. Where do I get the release form?

## 2021-06-30 NOTE — Telephone Encounter (Signed)
Patient called to request appointment, he was previously seen here in 2018. He then transferred care to [place]. He is now returning to the area and wanting to re-establish care with [place]. States he has about 2 weeks of medication left. He accepts appointment with pharmacy tomorrow 10/26.  Beryle Flock, RN

## 2021-07-01 ENCOUNTER — Ambulatory Visit: Payer: 59

## 2021-07-01 ENCOUNTER — Other Ambulatory Visit: Payer: Self-pay

## 2021-07-01 ENCOUNTER — Other Ambulatory Visit: Payer: Self-pay | Admitting: Infectious Diseases

## 2021-07-01 ENCOUNTER — Ambulatory Visit (INDEPENDENT_AMBULATORY_CARE_PROVIDER_SITE_OTHER): Payer: Self-pay | Admitting: Pharmacist

## 2021-07-01 ENCOUNTER — Other Ambulatory Visit (HOSPITAL_COMMUNITY): Payer: Self-pay

## 2021-07-01 ENCOUNTER — Other Ambulatory Visit (HOSPITAL_COMMUNITY)
Admission: RE | Admit: 2021-07-01 | Discharge: 2021-07-01 | Disposition: A | Discharge status: Home / Self Care | Payer: 59 | Source: Ambulatory Visit | Attending: Infectious Diseases | Admitting: Infectious Diseases

## 2021-07-01 DIAGNOSIS — Z113 Encounter for screening for infections with a predominantly sexual mode of transmission: Secondary | ICD-10-CM | POA: Diagnosis present

## 2021-07-01 DIAGNOSIS — B20 Human immunodeficiency virus [HIV] disease: Secondary | ICD-10-CM

## 2021-07-01 DIAGNOSIS — Z79899 Other long term (current) drug therapy: Secondary | ICD-10-CM

## 2021-07-01 MED ORDER — JULUCA 50-25 MG PO TABS
1.0000 | ORAL_TABLET | Freq: Every day | ORAL | 1 refills | Status: DC
  Filled 2021-07-01 (×2): qty 30, 30d supply, fill #0

## 2021-07-01 NOTE — Telephone Encounter (Signed)
Patient has appt 10/26 with pharmacy team; refills provided then

## 2021-07-01 NOTE — Progress Notes (Signed)
HPI: Alexander Hancock is a 30 y.o. male who presents to the [facility] clinic today to transfer care for his HIV infection.  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  Previous Medications   CYCLOBENZAPRINE (FLEXERIL) 10 MG TABLET    Take 1 tablet (10 mg total) by mouth 2 (two) times daily as needed for muscle spasms.   DOLUTEGRAVIR-RILPIVIRINE (JULUCA) 50-25 MG TABS    Take 1 tablet by mouth daily.   IBUPROFEN (ADVIL,MOTRIN) 800 MG TABLET    Take 1 tablet (800 mg total) by mouth 3 (three) times daily.   PROMETHAZINE (PHENERGAN) 25 MG TABLET    Take 1 tablet (25 mg total) by mouth every 8 (eight) hours as needed for nausea or vomiting.   TIVICAY 50 MG TABLET    TAKE 1 TABLET BY MOUTH DAILY  Modified Medications   No medications on file  Discontinued Medications   No medications on file    Allergies: No Known Allergies  Past Medical History: Past Medical History:  Diagnosis Date   Allergy    Anal warts    HIV infection ([place])    Hypercholesteremia 06/14/2014   Perirectal ulcer 04/24/2013    Social History: Social History   Socioeconomic History   Marital status: Single    Spouse name: Not on file   Number of children: Not on file   Years of education: Not on file   Highest education level: Not on file  Occupational History   Not on file  Tobacco Use   Smoking status: Never   Smokeless tobacco: Never  Substance and Sexual Activity   Alcohol use: Yes    Alcohol/week: 1.0 standard drink    Types: 1 drink(s) per week   Drug use: No   Sexual activity: Never    Comment: given condoms  Other Topics  Concern   Not on file  Social History Narrative   Not on file   Social Determinants of Health   Financial Resource Strain: Not on file  Food Insecurity: Not on file  Transportation Needs: Not on file  Physical Activity: Not on file  Stress: Not on file  Social Connections: Not on file    Labs: Lab Results  Component Value Date   HIV1RNAQUANT <20 NOT DETECTED 12/17/2019   HIV1RNAQUANT <20 NOT DETECTED 06/06/2019   HIV1RNAQUANT <20 06/22/2016   CD4TABS 958 12/17/2019   CD4TABS 892 06/06/2019   CD4TABS 857 06/10/2015    RPR and STI Lab Results  Component Value Date   LABRPR REACTIVE (A) 06/06/2019   LABRPR REACTIVE (A) 06/10/2015   LABRPR REACTIVE (A) 11/06/2013   LABRPR REACTIVE (A) 04/16/2013   LABRPR NON REAC 12/01/2010   RPRTITER 1:4 (H) 06/06/2019   RPRTITER 1:4 06/10/2015   RPRTITER 1:16 (A) 11/06/2013   RPRTITER 1:32 (A) 04/16/2013    STI Results GC CT  06/06/2019 Negative Negative    Hepatitis B Lab Results  Component Value Date   HEPBSAB POS (A) 12/01/2010   HEPBSAG NEGATIVE 12/01/2010   HEPBCAB NEG 12/01/2010   Hepatitis C No results found for: HEPCAB, HCVRNAPCRQN Hepatitis A Lab Results  Component Value Date   HAV NEG 12/01/2010   Lipids: Lab Results  Component Value Date   CHOL 215 (H) 12/17/2019   TRIG 489 (H) 12/17/2019   HDL 37 (L) 12/17/2019   CHOLHDL 5.8 (H) 12/17/2019   VLDL 47 (H) 06/22/2016   LDLCALC  12/17/2019     Comment:     . LDL cholesterol not calculated. Triglyceride levels greater than 400 mg/dL invalidate calculated LDL results. . Reference range: <100 . Desirable range <100 mg/dL for primary prevention;   <70 mg/dL for patients with CHD or diabetic patients  with > or = 2 CHD risk factors. Marland Kitchen LDL-C is now calculated using the Martin-Hopkins  calculation, which is a validated novel method providing  better accuracy than the Friedewald equation in the  estimation of LDL-C.  Cresenciano Genre et al. Annamaria Helling. 0037;048(88):  2061-2068  (http://education.[employer].com/faq/FAQ164)     Current HIV Regimen: Juluca   [name] is here today to initiate care with Dr. Johnnye Sima for his HIV infection.  Patient recently moved back to [place] from [place] with temporary living in [place]. He is originally from here and wanted to move back to be closer to family and friends. He previously worked with Dr. Johnnye Sima (and Dr. Baxter Flattery) when he lived here before. 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 Faxed records request today to previous clinic and will collect all baseline labs today. Patient states he has not been sexually active since moving but requested rectal cytology today as he is always the receptive partner. He states he does not perform/receive oral sex.   Patient has been taking Juluca for the past several years. He previously took Piute when first diagnosed in 2012 and then Truvada + Tivicay for once daily dosing but states he was non-adherent due to some depression. 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 He was then changed to Prezcobix +Truvada. Later he requested a single tablet regimen and was changed to Amasa (this was prior to the FDA approval of [place] and [place]). States he has maintained excellent adherence while on Juluca. Will continue for now until Dr. Johnnye Sima evaluates him. Based on the current genotype history I see documented, he could tolerate Biktarvy (or Symtuza) with two active agents.   Due to his job change, his current insurance will expire on November 6th, and he will start his new job on November 7th. Despite the potential lapse in coverage, he does not qualify for assistance per Juliann Pulse given he is over income. Advised him to pick up his Juluca today from [facility] as it free of charge with his current insurance. This will cover him for one month while we wait for  his new insurance and work to ensure the most appropriate copay coverage.   Plan: Continue Juluca Check HIV RNA, CD4, Cmet, CBC, lipid panel, RPR, and urine/rectal cytologies Follow-up with Dr. Johnnye Sima on 11/11 at 9:00  Alfonse Spruce, PharmD, CPP Clinical Pharmacist Practitioner [facility] 07/01/2021, 11:29 AM

## 2021-07-02 LAB — URINE CYTOLOGY ANCILLARY ONLY
Chlamydia: NEGATIVE
Comment: NEGATIVE
Comment: NORMAL
Neisseria Gonorrhea: NEGATIVE

## 2021-07-02 LAB — T-HELPER CELL (CD4) - (RCID CLINIC ONLY)
CD4 % Helper T Cell: 34 % (ref 33–65)
CD4 T Cell Abs: 628 /uL (ref 400–1790)

## 2021-07-02 LAB — CYTOLOGY, (ORAL, ANAL, URETHRAL) ANCILLARY ONLY
Chlamydia: NEGATIVE
Comment: NEGATIVE
Comment: NORMAL
Neisseria Gonorrhea: NEGATIVE

## 2021-07-05 LAB — CBC WITH DIFFERENTIAL/PLATELET
Absolute Monocytes: 321 cells/uL (ref 200–950)
Basophils Absolute: 9 cells/uL (ref 0–200)
Basophils Relative: 0.2 %
Eosinophils Absolute: 62 cells/uL (ref 15–500)
Eosinophils Relative: 1.4 %
HCT: 43.9 % (ref 38.5–50.0)
Hemoglobin: 14.7 g/dL (ref 13.2–17.1)
Lymphs Abs: 1976 cells/uL (ref 850–3900)
MCH: 28.5 pg (ref 27.0–33.0)
MCHC: 33.5 g/dL (ref 32.0–36.0)
MCV: 85.1 fL (ref 80.0–100.0)
MPV: 10.8 fL (ref 7.5–12.5)
Monocytes Relative: 7.3 %
Neutro Abs: 2033 cells/uL (ref 1500–7800)
Neutrophils Relative %: 46.2 %
Platelets: 198 10*3/uL (ref 140–400)
RBC: 5.16 10*6/uL (ref 4.20–5.80)
RDW: 14.9 % (ref 11.0–15.0)
Total Lymphocyte: 44.9 %
WBC: 4.4 10*3/uL (ref 3.8–10.8)

## 2021-07-05 LAB — FLUORESCENT TREPONEMAL AB(FTA)-IGG-BLD: Fluorescent Treponemal ABS: REACTIVE — AB

## 2021-07-05 LAB — COMPLETE METABOLIC PANEL WITH GFR
AG Ratio: 1.3 (calc) (ref 1.0–2.5)
ALT: 33 U/L (ref 9–46)
AST: 22 U/L (ref 10–40)
Albumin: 4.4 g/dL (ref 3.6–5.1)
Alkaline phosphatase (APISO): 45 U/L (ref 36–130)
BUN: 18 mg/dL (ref 7–25)
CO2: 27 mmol/L (ref 20–32)
Calcium: 9.3 mg/dL (ref 8.6–10.3)
Chloride: 104 mmol/L (ref 98–110)
Creat: 1.09 mg/dL (ref 0.60–1.26)
Globulin: 3.4 g/dL (calc) (ref 1.9–3.7)
Glucose, Bld: 94 mg/dL (ref 65–99)
Potassium: 3.9 mmol/L (ref 3.5–5.3)
Sodium: 139 mmol/L (ref 135–146)
Total Bilirubin: 0.9 mg/dL (ref 0.2–1.2)
Total Protein: 7.8 g/dL (ref 6.1–8.1)
eGFR: 94 mL/min/{1.73_m2} (ref >=60)

## 2021-07-05 LAB — LIPID PANEL
Cholesterol: 222 mg/dL — ABNORMAL HIGH (ref <200)
HDL: 44 mg/dL (ref >=40)
LDL Cholesterol (Calc): 137 mg/dL (calc) — ABNORMAL HIGH
Non-HDL Cholesterol (Calc): 178 mg/dL (calc) — ABNORMAL HIGH (ref <130)
Total CHOL/HDL Ratio: 5 (calc) — ABNORMAL HIGH (ref <5.0)
Triglycerides: 268 mg/dL — ABNORMAL HIGH (ref <150)

## 2021-07-05 LAB — RPR TITER: RPR Titer: 1:4 {titer} — ABNORMAL HIGH

## 2021-07-05 LAB — RPR: RPR Ser Ql: REACTIVE — AB

## 2021-07-05 LAB — HIV-1 RNA QUANT-NO REFLEX-BLD
HIV 1 RNA Quant: 949 Copies/mL — ABNORMAL HIGH
HIV-1 RNA Quant, Log: 2.98 Log cps/mL — ABNORMAL HIGH

## 2021-07-17 ENCOUNTER — Ambulatory Visit: Payer: 59 | Admitting: Infectious Diseases

## 2021-07-23 ENCOUNTER — Telehealth: Payer: Self-pay

## 2021-07-23 NOTE — Telephone Encounter (Signed)
Called patient to reschedule appointment, no answer and voicemail box not set up.   Beryle Flock, RN

## 2021-07-27 ENCOUNTER — Other Ambulatory Visit (HOSPITAL_COMMUNITY): Payer: Self-pay

## 2021-09-14 ENCOUNTER — Other Ambulatory Visit (HOSPITAL_COMMUNITY): Payer: Self-pay

## 2021-09-17 ENCOUNTER — Other Ambulatory Visit (HOSPITAL_COMMUNITY): Payer: Self-pay

## 2021-09-18 ENCOUNTER — Telehealth: Payer: Self-pay

## 2021-09-18 NOTE — Telephone Encounter (Signed)
Called patient to offer overdue appointment, no answer and unable to leave voicemail.   Beryle Flock, RN

## 2021-09-23 ENCOUNTER — Telehealth: Payer: Self-pay | Admitting: Pharmacist

## 2021-09-23 ENCOUNTER — Other Ambulatory Visit (HOSPITAL_COMMUNITY): Payer: Self-pay

## 2021-09-23 NOTE — Telephone Encounter (Signed)
Alexander Hancock and clinic pharmacy staff have been trying to contact patient for refills of Juluca with no luck.  Patient last filled on 07/01/21. Looks like he is overdue for an appointment with [place] and had a lapse in insurance. He missed his appointment with Dr. Johnnye Sima to initiate care back in November. Clinic staff has been trying to reach him to reschedule.   Amos Micheals L. Erikah Thumm, PharmD [facility] Clinical Pharmacist Practitioner

## 2022-01-27 ENCOUNTER — Encounter: Payer: Self-pay | Admitting: Infectious Diseases

## 2022-06-29 DIAGNOSIS — C211 Malignant neoplasm of anal canal: Secondary | ICD-10-CM | POA: Diagnosis not present

## 2022-06-29 DIAGNOSIS — C210 Malignant neoplasm of anus, unspecified: Secondary | ICD-10-CM | POA: Diagnosis not present

## 2022-06-29 DIAGNOSIS — K648 Other hemorrhoids: Secondary | ICD-10-CM | POA: Diagnosis not present

## 2022-06-29 DIAGNOSIS — B20 Human immunodeficiency virus [HIV] disease: Secondary | ICD-10-CM | POA: Diagnosis not present

## 2022-07-06 DIAGNOSIS — C210 Malignant neoplasm of anus, unspecified: Secondary | ICD-10-CM | POA: Diagnosis not present

## 2022-07-06 DIAGNOSIS — R911 Solitary pulmonary nodule: Secondary | ICD-10-CM | POA: Diagnosis not present

## 2022-07-06 DIAGNOSIS — K629 Disease of anus and rectum, unspecified: Secondary | ICD-10-CM | POA: Diagnosis not present

## 2022-07-06 DIAGNOSIS — R590 Localized enlarged lymph nodes: Secondary | ICD-10-CM | POA: Diagnosis not present

## 2022-07-14 DIAGNOSIS — C210 Malignant neoplasm of anus, unspecified: Secondary | ICD-10-CM | POA: Diagnosis not present

## 2022-07-19 DIAGNOSIS — C20 Malignant neoplasm of rectum: Secondary | ICD-10-CM | POA: Diagnosis not present

## 2022-07-19 DIAGNOSIS — K639 Disease of intestine, unspecified: Secondary | ICD-10-CM | POA: Diagnosis not present

## 2022-07-19 DIAGNOSIS — K629 Disease of anus and rectum, unspecified: Secondary | ICD-10-CM | POA: Diagnosis not present

## 2022-07-19 DIAGNOSIS — C210 Malignant neoplasm of anus, unspecified: Secondary | ICD-10-CM | POA: Diagnosis not present

## 2022-07-26 ENCOUNTER — Other Ambulatory Visit: Payer: Self-pay

## 2022-07-26 DIAGNOSIS — I10 Essential (primary) hypertension: Secondary | ICD-10-CM | POA: Diagnosis not present

## 2022-07-26 DIAGNOSIS — Z5111 Encounter for antineoplastic chemotherapy: Secondary | ICD-10-CM | POA: Diagnosis not present

## 2022-07-26 DIAGNOSIS — C210 Malignant neoplasm of anus, unspecified: Secondary | ICD-10-CM | POA: Diagnosis not present

## 2022-07-26 DIAGNOSIS — B20 Human immunodeficiency virus [HIV] disease: Secondary | ICD-10-CM

## 2022-07-26 DIAGNOSIS — Z113 Encounter for screening for infections with a predominantly sexual mode of transmission: Secondary | ICD-10-CM

## 2022-07-27 ENCOUNTER — Other Ambulatory Visit: Payer: Self-pay

## 2022-07-27 ENCOUNTER — Ambulatory Visit: Payer: Self-pay

## 2022-07-27 ENCOUNTER — Other Ambulatory Visit: Payer: Federal, State, Local not specified - PPO

## 2022-07-27 ENCOUNTER — Other Ambulatory Visit (HOSPITAL_COMMUNITY)
Admission: RE | Admit: 2022-07-27 | Discharge: 2022-07-27 | Disposition: A | Discharge status: Home / Self Care | Payer: Federal, State, Local not specified - PPO | Source: Ambulatory Visit | Attending: Internal Medicine | Admitting: Internal Medicine

## 2022-07-27 DIAGNOSIS — B20 Human immunodeficiency virus [HIV] disease: Secondary | ICD-10-CM

## 2022-07-27 DIAGNOSIS — Z113 Encounter for screening for infections with a predominantly sexual mode of transmission: Secondary | ICD-10-CM | POA: Diagnosis not present

## 2022-07-28 LAB — URINE CYTOLOGY ANCILLARY ONLY
Chlamydia: NEGATIVE
Comment: NEGATIVE
Comment: NORMAL
Neisseria Gonorrhea: NEGATIVE

## 2022-07-30 LAB — CBC WITH DIFFERENTIAL/PLATELET
Absolute Monocytes: 292 cells/uL (ref 200–950)
Basophils Absolute: 22 cells/uL (ref 0–200)
Basophils Relative: 0.5 %
Eosinophils Absolute: 52 cells/uL (ref 15–500)
Eosinophils Relative: 1.2 %
HCT: 40.9 % (ref 38.5–50.0)
Hemoglobin: 14.5 g/dL (ref 13.2–17.1)
Lymphs Abs: 2111 cells/uL (ref 850–3900)
MCH: 30.3 pg (ref 27.0–33.0)
MCHC: 35.5 g/dL (ref 32.0–36.0)
MCV: 85.4 fL (ref 80.0–100.0)
MPV: 10.1 fL (ref 7.5–12.5)
Monocytes Relative: 6.8 %
Neutro Abs: 1823 cells/uL (ref 1500–7800)
Neutrophils Relative %: 42.4 %
Platelets: 215 10*3/uL (ref 140–400)
RBC: 4.79 10*6/uL (ref 4.20–5.80)
RDW: 14.6 % (ref 11.0–15.0)
Total Lymphocyte: 49.1 %
WBC: 4.3 10*3/uL (ref 3.8–10.8)

## 2022-07-30 LAB — COMPLETE METABOLIC PANEL WITH GFR
AG Ratio: 0.8 (calc) — ABNORMAL LOW (ref 1.0–2.5)
ALT: 24 U/L (ref 9–46)
AST: 17 U/L (ref 10–40)
Albumin: 3.9 g/dL (ref 3.6–5.1)
Alkaline phosphatase (APISO): 46 U/L (ref 36–130)
BUN: 17 mg/dL (ref 7–25)
CO2: 27 mmol/L (ref 20–32)
Calcium: 9.3 mg/dL (ref 8.6–10.3)
Chloride: 104 mmol/L (ref 98–110)
Creat: 0.97 mg/dL (ref 0.60–1.26)
Globulin: 4.9 g/dL (calc) — ABNORMAL HIGH (ref 1.9–3.7)
Glucose, Bld: 114 mg/dL — ABNORMAL HIGH (ref 65–99)
Potassium: 3.8 mmol/L (ref 3.5–5.3)
Sodium: 136 mmol/L (ref 135–146)
Total Bilirubin: 0.6 mg/dL (ref 0.2–1.2)
Total Protein: 8.8 g/dL — ABNORMAL HIGH (ref 6.1–8.1)
eGFR: 107 mL/min/{1.73_m2} (ref >=60)

## 2022-07-30 LAB — HIV-1 RNA QUANT-NO REFLEX-BLD
HIV 1 RNA Quant: 6480 Copies/mL — ABNORMAL HIGH
HIV-1 RNA Quant, Log: 3.81 Log cps/mL — ABNORMAL HIGH

## 2022-07-30 LAB — LIPID PANEL
Cholesterol: 195 mg/dL (ref <200)
HDL: 30 mg/dL — ABNORMAL LOW (ref >=40)
Non-HDL Cholesterol (Calc): 165 mg/dL (calc) — ABNORMAL HIGH (ref <130)
Total CHOL/HDL Ratio: 6.5 (calc) — ABNORMAL HIGH (ref <5.0)
Triglycerides: 645 mg/dL — ABNORMAL HIGH (ref <150)

## 2022-07-30 LAB — T-HELPER CELL (CD4) - (RCID CLINIC ONLY)
CD4 % Helper T Cell: 26 % — ABNORMAL LOW (ref 33–65)
CD4 T Cell Abs: 483 /uL (ref 400–1790)

## 2022-07-30 LAB — FLUORESCENT TREPONEMAL AB(FTA)-IGG-BLD: Fluorescent Treponemal ABS: REACTIVE — AB

## 2022-07-30 LAB — RPR: RPR Ser Ql: REACTIVE — AB

## 2022-07-30 LAB — RPR TITER: RPR Titer: 1:2 {titer} — ABNORMAL HIGH

## 2022-08-10 ENCOUNTER — Ambulatory Visit: Payer: Self-pay | Admitting: Internal Medicine

## 2022-08-10 DIAGNOSIS — C210 Malignant neoplasm of anus, unspecified: Secondary | ICD-10-CM | POA: Diagnosis not present

## 2022-08-10 DIAGNOSIS — R590 Localized enlarged lymph nodes: Secondary | ICD-10-CM | POA: Diagnosis not present

## 2022-08-10 DIAGNOSIS — D72822 Plasmacytosis: Secondary | ICD-10-CM | POA: Diagnosis not present

## 2022-08-10 DIAGNOSIS — Z452 Encounter for adjustment and management of vascular access device: Secondary | ICD-10-CM | POA: Diagnosis not present

## 2022-08-10 DIAGNOSIS — C211 Malignant neoplasm of anal canal: Secondary | ICD-10-CM | POA: Diagnosis not present

## 2022-08-11 DIAGNOSIS — C210 Malignant neoplasm of anus, unspecified: Secondary | ICD-10-CM | POA: Diagnosis not present

## 2022-08-11 DIAGNOSIS — C20 Malignant neoplasm of rectum: Secondary | ICD-10-CM | POA: Diagnosis not present

## 2022-08-12 ENCOUNTER — Other Ambulatory Visit (HOSPITAL_COMMUNITY): Payer: Self-pay

## 2022-08-12 ENCOUNTER — Other Ambulatory Visit: Payer: Self-pay

## 2022-08-12 ENCOUNTER — Ambulatory Visit: Payer: Federal, State, Local not specified - PPO | Admitting: Internal Medicine

## 2022-08-12 ENCOUNTER — Encounter: Payer: Self-pay | Admitting: Internal Medicine

## 2022-08-12 VITALS — BP 155/110 | HR 92 | Temp 97.9°F | Wt 289.0 lb

## 2022-08-12 DIAGNOSIS — C20 Malignant neoplasm of rectum: Secondary | ICD-10-CM

## 2022-08-12 DIAGNOSIS — A539 Syphilis, unspecified: Secondary | ICD-10-CM | POA: Diagnosis not present

## 2022-08-12 DIAGNOSIS — B20 Human immunodeficiency virus [HIV] disease: Secondary | ICD-10-CM | POA: Diagnosis not present

## 2022-08-12 DIAGNOSIS — R591 Generalized enlarged lymph nodes: Secondary | ICD-10-CM | POA: Diagnosis not present

## 2022-08-12 DIAGNOSIS — Z7185 Encounter for immunization safety counseling: Secondary | ICD-10-CM | POA: Insufficient documentation

## 2022-08-12 DIAGNOSIS — Z21 Asymptomatic human immunodeficiency virus [HIV] infection status: Secondary | ICD-10-CM

## 2022-08-12 MED ORDER — SYMTUZA 800-150-200-10 MG PO TABS: 1.0000 | ORAL_TABLET | Freq: Every day | ORAL | 5 refills | Status: DC

## 2022-08-12 NOTE — Assessment & Plan Note (Signed)
Patient with history of treated syphilis and serofast at 1:2.  Will continue monitoring periodically.

## 2022-08-12 NOTE — Progress Notes (Signed)
[facility]   CHIEF COMPLAINT    HIV follow up.    SUBJECTIVE:    Alexander Hancock is a 31 y.o. male with PMHx as below who presents to the clinic for HIV follow up.   Patient is here today as a new HIV patient to myself.  He was seen by Dr Johnnye Sima in Oct 2020.  During that visit per the notes he was taking [place] and Tivicay sparingly.   Dr Johnnye Sima changed him to Juluca at that time as patient expressed interest in [place].  Since that time there are no further visits with Dr Johnnye Sima.  Patient saw pharmacy in October 2022 and noted that he had been living recently in [place] and [place].    Per the notes on 07/01/21: "Patient has been taking Juluca for the past several years. He previously took [place] when first diagnosed in 2012 and then Truvada + Tivicay for once daily dosing but states he was non-adherent due to some depression. 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 He was then changed to Prezcobix +Truvada. Later he requested a single tablet regimen and was changed to [place] (this was prior to the FDA approval of [place] and [place]). States he has maintained excellent adherence while on Juluca. Will continue for now until Dr. Johnnye Sima evaluates him. Based on the current genotype history I see documented, he could tolerate Biktarvy (or Symtuza) with two active agents."  Patient had a viral load at that time of 949 copies in Oct 2022.  There has been no further ID follow up since that time.  He came in for labs on 07/27/22 and was found to have CD4 count 483, viral load 6480 copies, RPR titer 1:2, and urinary GC/CT negative.   Patient reports that he has not been taking Juluca for over a year.      He was seen yesterday by radiation oncology at [facility] for invasive mucinous adenocarcinoma of the anorectum.  Imaging also showed inguinal, iliac and axillary adenopathy.  Biopsy was recommended to determine metastatic adenocarcinoma vs a  secondary malignancy like lymphoma.  He underwent US guided biopsy of right inguinal node.  Pathology is pending but preliminarily there was no carcincoma in the inguinal LN specimen.  It is being sent out for further studies to ensure no evidence of lymphoma.      Please see A&P for the details of today's visit and status of the patient's medical problems.   Patient's Medications  New Prescriptions   DARUNAVIR-COBICISTAT-EMTRICITABINE-TENOFOVIR ALAFENAMIDE (SYMTUZA) 800-150-200-10 MG TABS    Take 1 tablet by mouth daily with breakfast.  Previous Medications   LISINOPRIL (ZESTRIL) 20 MG TABLET    Take 1 tablet by mouth daily.  Modified Medications   No medications on file  Discontinued Medications   CYCLOBENZAPRINE (FLEXERIL) 10 MG TABLET    Take 1 tablet (10 mg total) by mouth 2 (two) times daily as needed for muscle spasms.   DOLUTEGRAVIR-RILPIVIRINE (JULUCA) 50-25 MG TABLET    Take 1 tablet by mouth daily before lunch.   IBUPROFEN (ADVIL,MOTRIN) 800 MG TABLET    Take 1 tablet (800 mg total) by mouth 3 (three) times daily.   PROMETHAZINE (PHENERGAN) 25 MG TABLET    Take 1 tablet (25 mg total) by mouth every 8 (eight) hours as needed for nausea or vomiting.      Past Medical History:  Diagnosis Date   Allergy    Anal  warts    HIV infection ([place])    Hypercholesteremia 06/14/2014   Perirectal ulcer 04/24/2013    Social History   Tobacco Use   Smoking status: Never   Smokeless tobacco: Never  Substance Use Topics   Alcohol use: Yes    Alcohol/week: 1.0 standard drink of alcohol    Types: 1 drink(s) per week   Drug use: No    Family History  Problem Relation Age of Onset   Multiple sclerosis Mother     No Known Allergies  Review of Systems  Constitutional: Negative.   Respiratory: Negative.    Cardiovascular: Negative.      OBJECTIVE:    Vitals:   08/12/22 0925  BP: (!) 155/110  Pulse: 92  Temp: 97.9 F (36.6 C)  TempSrc: Oral  SpO2: 98%  Weight: 289 lb  (131.1 kg)     Body mass index is 40.31 kg/m.  Physical Exam Constitutional:      Appearance: Normal appearance.  HENT:     Head: Normocephalic and atraumatic.  Abdominal:     General: There is no distension.     Palpations: Abdomen is soft.  Skin:    General: Skin is warm and dry.  Neurological:     General: No focal deficit present.     Mental Status: He is alert and oriented to person, place, and time.  Psychiatric:        Mood and Affect: Mood normal.        Behavior: Behavior normal.     Labs and Microbiology:    Latest Ref Rng & Units 07/27/2022    9:21 AM 07/01/2021   11:11 AM 12/17/2019    2:38 PM  CMP  Glucose 65 - 99 mg/dL 114  94  90   BUN 7 - 25 mg/dL '17  18  12   '$ Creatinine 0.60 - 1.26 mg/dL 0.97  1.09  1.01   Sodium 135 - 146 mmol/L 136  139  137   Potassium 3.5 - 5.3 mmol/L 3.8  3.9  4.2   Chloride 98 - 110 mmol/L 104  104  101   CO2 20 - 32 mmol/L '27  27  31   '$ Calcium 8.6 - 10.3 mg/dL 9.3  9.3  9.7   Total Protein 6.1 - 8.1 g/dL 8.8  7.8  7.7   Total Bilirubin 0.2 - 1.2 mg/dL 0.6  0.9  0.8   AST 10 - 40 U/L '17  22  14   '$ ALT 9 - 46 U/L 24  33  18       Latest Ref Rng & Units 07/27/2022    9:21 AM 07/01/2021   11:11 AM 12/17/2019    2:38 PM  CBC  WBC 3.8 - 10.8 Thousand/uL 4.3  4.4  6.2   Hemoglobin 13.2 - 17.1 g/dL 14.5  14.7  14.4   Hematocrit 38.5 - 50.0 % 40.9  43.9  43.6   Platelets 140 - 400 Thousand/uL 215  198  214      Lab Results  Component Value Date   HIV1RNAQUANT 6,480 (H) 07/27/2022   HIV1RNAQUANT 949 (H) 07/01/2021   HIV1RNAQUANT <20 NOT DETECTED 12/17/2019   CD4TABS 483 07/27/2022   CD4TABS 628 07/01/2021   CD4TABS 958 12/17/2019    RPR and STI: Lab Results  Component Value Date   LABRPR REACTIVE (A) 07/27/2022   LABRPR REACTIVE (A) 07/01/2021   LABRPR REACTIVE (A) 06/06/2019   LABRPR REACTIVE (A) 06/10/2015   LABRPR REACTIVE (  A) 11/06/2013   RPRTITER 1:2 (H) 07/27/2022   RPRTITER 1:4 (H) 07/01/2021   RPRTITER 1:4  (H) 06/06/2019   RPRTITER 1:4 06/10/2015   RPRTITER 1:16 (A) 11/06/2013    STI Results GC CT  07/27/2022  9:42 AM Negative  Negative   07/01/2021 11:05 AM Negative  Negative   07/01/2021 10:45 AM Negative  Negative   06/06/2019  2:25 PM Negative  Negative     Hepatitis B: Lab Results  Component Value Date   HEPBSAB POS (A) 12/01/2010   HEPBSAG NEGATIVE 12/01/2010   HEPBCAB NEG 12/01/2010   Hepatitis C: No results found for: "HEPCAB", "HCVRNAPCRQN" Hepatitis A: Lab Results  Component Value Date   HAV NEG 12/01/2010   Lipids: Lab Results  Component Value Date   CHOL 195 07/27/2022   TRIG 645 (H) 07/27/2022   HDL 30 (L) 07/27/2022   CHOLHDL 6.5 (H) 07/27/2022   VLDL 47 (H) 06/22/2016   LDLCALC  07/27/2022     Comment:     . LDL cholesterol not calculated. Triglyceride levels greater than 400 mg/dL invalidate calculated LDL results. . Reference range: <100 . Desirable range <100 mg/dL for primary prevention;   <70 mg/dL for patients with CHD or diabetic patients  with > or = 2 CHD risk factors. Marland Kitchen LDL-C is now calculated using the Martin-Hopkins  calculation, which is a validated novel method providing  better accuracy than the Friedewald equation in the  estimation of LDL-C.  Cresenciano Genre et al. Annamaria Helling. 6063;016(01): 2061-2068  (http://education.[facility].com/faq/FAQ164)     Imaging:    ASSESSMENT & PLAN:    Syphilis Patient with history of treated syphilis and serofast at 1:2.  Will continue monitoring periodically.  Rectal cancer [facility]) Recently diagnosed rectal cancer and being followed at [facility].  They are recommending chemo and radiation per his report but would like his HIV to become controlled prior to treatment.   HIV (human immunodeficiency virus infection) ([place]) Patient here today to re-engage in care.  He has been off ART for over year but interested in resuming treatment right now.  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  For now, will start [place] given history of prior INSTI exposure and non-adherence in the past.  Check HIV genotype today and follow up in 4-6 weeks for repeat labs.    Vaccine counseling Will check hepatitis A immunity and HCV status.  At follow up, will discuss PCV 20 vaccine update.   Orders Placed This Encounter  Procedures   HIV-1 Genotyping (RTI,PI,IN [facility]er)   Hepatitis C antibody   Hepatitis A antibody, total        [facility] 08/12/2022, 10:26 AM  I have personally spent 60 minutes involved in face-to-face and non-face-to-face activities for this patient on the day of the visit. Professional time spent includes the following activities: Preparing to see the patient (review of tests), Obtaining and/or reviewing separately obtained history (admission/discharge record), Performing a medically appropriate examination and/or evaluation , Ordering medications/tests/procedures, referring and communicating with other health care professionals, Documenting clinical information in the EMR, Independently interpreting results (not separately reported), Communicating results to the patient/family/caregiver, Counseling and educating the patient/family/caregiver and Care coordination (not separately reported).

## 2022-08-12 NOTE — Assessment & Plan Note (Signed)
Recently diagnosed rectal cancer and being followed at [facility].  They are recommending chemo and radiation per his report but would like his HIV to become controlled prior to treatment.

## 2022-08-12 NOTE — Assessment & Plan Note (Signed)
Patient here today to re-engage in care.  He has been off ART for over year but interested in resuming treatment right now.  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  For now, will start [place] given history of prior INSTI exposure and non-adherence in the past.  Check HIV genotype today and follow up in 4-6 weeks for repeat labs.

## 2022-08-12 NOTE — Assessment & Plan Note (Signed)
Will check hepatitis A immunity and HCV status.  At follow up, will discuss PCV 20 vaccine update.

## 2022-08-17 DIAGNOSIS — I10 Essential (primary) hypertension: Secondary | ICD-10-CM | POA: Diagnosis not present

## 2022-08-17 DIAGNOSIS — C210 Malignant neoplasm of anus, unspecified: Secondary | ICD-10-CM | POA: Diagnosis not present

## 2022-08-17 DIAGNOSIS — B20 Human immunodeficiency virus [HIV] disease: Secondary | ICD-10-CM | POA: Diagnosis not present

## 2022-08-17 DIAGNOSIS — Z95828 Presence of other vascular implants and grafts: Secondary | ICD-10-CM | POA: Diagnosis not present

## 2022-08-21 LAB — HIV-1 GENOTYPING (RTI,PI,IN INHBTR): HIV-1 Genotype: DETECTED — AB

## 2022-08-21 LAB — HEPATITIS A ANTIBODY, TOTAL: Hepatitis A AB,Total: REACTIVE — AB

## 2022-08-21 LAB — HEPATITIS C ANTIBODY: Hepatitis C Ab: NONREACTIVE

## 2022-08-23 ENCOUNTER — Telehealth: Payer: Self-pay

## 2022-08-23 DIAGNOSIS — C210 Malignant neoplasm of anus, unspecified: Secondary | ICD-10-CM | POA: Diagnosis not present

## 2022-08-23 DIAGNOSIS — C20 Malignant neoplasm of rectum: Secondary | ICD-10-CM | POA: Diagnosis not present

## 2022-08-23 NOTE — Telephone Encounter (Signed)
I reviewed his recent genotype in addition to his past ones that are available in Epic, and [place] is definitely ok for him. He has no resistance to protease inhibitors, and only has some resistance to emtricitabine with M184V which Dr. Juleen China is already aware of. - Estill Bamberg

## 2022-08-23 NOTE — Telephone Encounter (Signed)
Thank you. Patient made aware.

## 2022-08-23 NOTE — Telephone Encounter (Signed)
Patient called questioning his genotype lab results and if he is ok to continue to take [place]. Routing to pharmacy and provider to review labs and advise.   Eugenia Mcalpine, LPN

## 2022-08-24 DIAGNOSIS — C210 Malignant neoplasm of anus, unspecified: Secondary | ICD-10-CM | POA: Diagnosis not present

## 2022-08-24 DIAGNOSIS — R030 Elevated blood-pressure reading, without diagnosis of hypertension: Secondary | ICD-10-CM | POA: Diagnosis not present

## 2022-09-01 DIAGNOSIS — Z95828 Presence of other vascular implants and grafts: Secondary | ICD-10-CM | POA: Diagnosis not present

## 2022-09-01 DIAGNOSIS — Z5111 Encounter for antineoplastic chemotherapy: Secondary | ICD-10-CM | POA: Diagnosis not present

## 2022-09-01 DIAGNOSIS — C210 Malignant neoplasm of anus, unspecified: Secondary | ICD-10-CM | POA: Diagnosis not present

## 2022-09-15 ENCOUNTER — Ambulatory Visit: Payer: Federal, State, Local not specified - PPO | Admitting: Internal Medicine

## 2022-09-15 DIAGNOSIS — Z79624 Long term (current) use of inhibitors of nucleotide synthesis: Secondary | ICD-10-CM | POA: Diagnosis not present

## 2022-09-15 DIAGNOSIS — C211 Malignant neoplasm of anal canal: Secondary | ICD-10-CM | POA: Diagnosis not present

## 2022-09-15 DIAGNOSIS — C210 Malignant neoplasm of anus, unspecified: Secondary | ICD-10-CM | POA: Diagnosis not present

## 2022-09-15 DIAGNOSIS — R599 Enlarged lymph nodes, unspecified: Secondary | ICD-10-CM | POA: Diagnosis not present

## 2022-09-15 DIAGNOSIS — I10 Essential (primary) hypertension: Secondary | ICD-10-CM | POA: Diagnosis not present

## 2022-09-15 DIAGNOSIS — B20 Human immunodeficiency virus [HIV] disease: Secondary | ICD-10-CM | POA: Diagnosis not present

## 2022-09-15 DIAGNOSIS — Z95828 Presence of other vascular implants and grafts: Secondary | ICD-10-CM | POA: Diagnosis not present

## 2022-09-15 DIAGNOSIS — Z5111 Encounter for antineoplastic chemotherapy: Secondary | ICD-10-CM | POA: Diagnosis not present

## 2022-09-15 DIAGNOSIS — Z79899 Other long term (current) drug therapy: Secondary | ICD-10-CM | POA: Diagnosis not present

## 2022-09-15 DIAGNOSIS — D709 Neutropenia, unspecified: Secondary | ICD-10-CM | POA: Diagnosis not present

## 2022-09-15 NOTE — Progress Notes (Deleted)
[facility]ious [facility]   CHIEF COMPLAINT    HIV follow up.    SUBJECTIVE:    Alexander Hancock is a 32 y.o. male with PMHx as below who presents to the clinic for HIV follow up.   Please see A&P for the details of today's visit and status of the patient's medical problems.   Patient's Medications  New Prescriptions   No medications on file  Previous Medications   DARUNAVIR-COBICISTAT-EMTRICITABINE-TENOFOVIR ALAFENAMIDE (SYMTUZA) 800-150-200-10 MG TABS    Take 1 tablet by mouth daily with breakfast.   LISINOPRIL (ZESTRIL) 20 MG TABLET    Take 1 tablet by mouth daily.  Modified Medications   No medications on file  Discontinued Medications   No medications on file      Past Medical History:  Diagnosis Date   Allergy    Anal warts    HIV infection ([place])    Hypercholesteremia 06/14/2014   Perirectal ulcer 04/24/2013    Social History   Tobacco Use   Smoking status: Never   Smokeless tobacco: Never  Substance Use Topics   Alcohol use: Yes    Alcohol/week: 1.0 standard drink of alcohol    Types: 1 drink(s) per week   Drug use: No    Family History  Problem Relation Age of Onset   Multiple sclerosis Mother     No Known Allergies  ROS   OBJECTIVE:    There were no vitals filed for this visit.   There is no height or weight on file to calculate BMI.  Physical Exam  Labs and Microbiology:    Latest Ref Rng & Units 07/27/2022    9:21 AM 07/01/2021   11:11 AM 12/17/2019    2:38 PM  CMP  Glucose 65 - 99 mg/dL 114  94  90   BUN 7 - 25 mg/dL 17  18  12   $ Creatinine 0.60 - 1.26 mg/dL 0.97  1.09  1.01   Sodium 135 - 146 mmol/L 136  139  137   Potassium 3.5 - 5.3 mmol/L 3.8  3.9  4.2   Chloride 98 - 110 mmol/L 104  104  101   CO2 20 - 32 mmol/L 27  27  31   $ Calcium 8.6 - 10.3 mg/dL 9.3  9.3  9.7   Total Protein 6.1 - 8.1 g/dL 8.8  7.8  7.7   Total Bilirubin 0.2 - 1.2 mg/dL 0.6  0.9  0.8   AST 10 - 40 U/L 17  22  14   $ ALT 9 - 46  U/L 24  33  18       Latest Ref Rng & Units 07/27/2022    9:21 AM 07/01/2021   11:11 AM 12/17/2019    2:38 PM  CBC  WBC 3.8 - 10.8 Thousand/uL 4.3  4.4  6.2   Hemoglobin 13.2 - 17.1 g/dL 14.5  14.7  14.4   Hematocrit 38.5 - 50.0 % 40.9  43.9  43.6   Platelets 140 - 400 Thousand/uL 215  198  214      Lab Results  Component Value Date   HIV1RNAQUANT 6,480 (H) 07/27/2022   HIV1RNAQUANT 949 (H) 07/01/2021   HIV1RNAQUANT <20 NOT DETECTED 12/17/2019   CD4TABS 483 07/27/2022   CD4TABS 628 07/01/2021   CD4TABS 958 12/17/2019    RPR and STI: Lab Results  Component Value Date   LABRPR REACTIVE (A) 07/27/2022   LABRPR REACTIVE (A) 07/01/2021  LABRPR REACTIVE (A) 06/06/2019   LABRPR REACTIVE (A) 06/10/2015   LABRPR REACTIVE (A) 11/06/2013   RPRTITER 1:2 (H) 07/27/2022   RPRTITER 1:4 (H) 07/01/2021   RPRTITER 1:4 (H) 06/06/2019   RPRTITER 1:4 06/10/2015   RPRTITER 1:16 (A) 11/06/2013    STI Results GC CT  07/27/2022  9:42 AM Negative  Negative   07/01/2021 11:05 AM Negative  Negative   07/01/2021 10:45 AM Negative  Negative   06/06/2019  2:25 PM Negative  Negative     Hepatitis B: Lab Results  Component Value Date   HEPBSAB POS (A) 12/01/2010   HEPBSAG NEGATIVE 12/01/2010   HEPBCAB NEG 12/01/2010   Hepatitis C: Lab Results  Component Value Date   HEPCAB NON-REACTIVE 08/12/2022   Hepatitis A: Lab Results  Component Value Date   HAV REACTIVE (A) 08/12/2022   Lipids: Lab Results  Component Value Date   CHOL 195 07/27/2022   TRIG 645 (H) 07/27/2022   HDL 30 (L) 07/27/2022   CHOLHDL 6.5 (H) 07/27/2022   VLDL 47 (H) 06/22/2016   LDLCALC  07/27/2022     Comment:     . LDL cholesterol not calculated. Triglyceride levels greater than 400 mg/dL invalidate calculated LDL results. . Reference range: <100 . Desirable range <100 mg/dL for primary prevention;   <70 mg/dL for patients with CHD or diabetic patients  with > or = 2 CHD risk factors. Marland Kitchen LDL-C is  now calculated using the Martin-Hopkins  calculation, which is a validated novel method providing  better accuracy than the Friedewald equation in the  estimation of LDL-C.  Cresenciano Genre et al. Annamaria Helling. [id number]): 2061-2068  (http://education.[facility].com/faq/FAQ164)     Imaging:    ASSESSMENT & PLAN:    No problem-specific Assessment & Plan notes found for this encounter.   No orders of the defined types were placed in this encounter.      *** Vaccines Influenza: give every year COVID: recommend vaccination if not already done Prevnar 20: Give x 1 if no prior pneumonia vaccine.    - If only 1 dose of either PPSV 23 OR PCV 13 ----> give PCV 20 if > 1 year since last vaccine  - If received both PPSV 23 AND PCV 13 ----> give PCV 20 if > 5 years since last vaccine.  If < 5 years then wait to give PCV 20 Pneumovax-23: (if CD4 >200) give twice every 5 years apart before age 42, then once at age 69.  Give >8 weeks from [place] Prevnar-13: (preferably when CD4 >200) give once, give >1 year from last Pneumovax-23 Hepatitis A: give Havrix 2 dose series at 0 and 6-12 months if non-immune Hepatitis B: give Heplisav 2 dose series at 0 and 4 weeks if non-immune.  Repeat serology 2 months after vaccine and revaccinate if needed MenACWY: 2 dose primary series 8 weeks apart, then 1 dose booster every 5 years HPV: Gardasil-9 at 0, 2, and 6 months for ages 9-26 should be vaccinated.  Ages 33-45 should be offered if appropriate Tdap: give every 10 years Shingles: give Shingrix 2 dose series at 0 and 2-6 months if >50 years on ART with CD4 cell count >200 Varicella: primary vaccination may be considered in VZV seronegative persons aged >8 years (if CD4 >200)  MMR: vaccine should be given if born in [birth year] or after and do not have immunity (if CD4 >200)  Screening DEXA Scan: if age >67 Quantiferon: check at initiation of care Hepatitis C: check at initiation  of care.  Screen annually if  risk factors HLA B5701: check at initiation of care G6PD: check if starting therapy with oxidant drugs Lipids: check annually Urinalysis: check annually or every 6 months if on tenofovir Hgb A1c: check annually  ASCVD Risk Score Consider high-intensity statin therapy if 10-year ASCVD risk score >7.5% The ASCVD Risk score (Arnett DK, et al., 2019) failed to calculate for the following reasons:   The 2019 ASCVD risk score is only valid for ages 63 to [place] [facility] [place] 09/15/2022, 5:26 AM  HIV: Patient is here today for close follow up after returning to care in 08/12/22.  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  Genotyping done last month showed no resistance in the setting of being off medication for about 1 year.  He is tolerating Symtuza without issues.  Will continue and check his viral load.  Follow up in 3 months.   Need for PCP PPx: ***  Vaccines: Recommended PCV 20 vaccine and he ***.   STI/Screening: ***  Encounter for medication monitoring: ***

## 2022-09-29 DIAGNOSIS — B20 Human immunodeficiency virus [HIV] disease: Secondary | ICD-10-CM | POA: Diagnosis not present

## 2022-09-29 DIAGNOSIS — C210 Malignant neoplasm of anus, unspecified: Secondary | ICD-10-CM | POA: Diagnosis not present

## 2022-09-29 DIAGNOSIS — Z79899 Other long term (current) drug therapy: Secondary | ICD-10-CM | POA: Diagnosis not present

## 2022-09-29 DIAGNOSIS — Z6841 Body Mass Index (BMI) 40.0 and over, adult: Secondary | ICD-10-CM | POA: Diagnosis not present

## 2022-09-29 DIAGNOSIS — D709 Neutropenia, unspecified: Secondary | ICD-10-CM | POA: Diagnosis not present

## 2022-09-29 DIAGNOSIS — I10 Essential (primary) hypertension: Secondary | ICD-10-CM | POA: Diagnosis not present

## 2022-09-29 DIAGNOSIS — C211 Malignant neoplasm of anal canal: Secondary | ICD-10-CM | POA: Diagnosis not present

## 2022-10-18 DIAGNOSIS — Z95828 Presence of other vascular implants and grafts: Secondary | ICD-10-CM | POA: Diagnosis not present

## 2022-10-18 DIAGNOSIS — B20 Human immunodeficiency virus [HIV] disease: Secondary | ICD-10-CM | POA: Diagnosis not present

## 2022-10-18 DIAGNOSIS — Z79624 Long term (current) use of inhibitors of nucleotide synthesis: Secondary | ICD-10-CM | POA: Diagnosis not present

## 2022-10-18 DIAGNOSIS — J069 Acute upper respiratory infection, unspecified: Secondary | ICD-10-CM | POA: Diagnosis not present

## 2022-10-18 DIAGNOSIS — R599 Enlarged lymph nodes, unspecified: Secondary | ICD-10-CM | POA: Diagnosis not present

## 2022-10-18 DIAGNOSIS — C20 Malignant neoplasm of rectum: Secondary | ICD-10-CM | POA: Diagnosis not present

## 2022-10-18 DIAGNOSIS — C211 Malignant neoplasm of anal canal: Secondary | ICD-10-CM | POA: Diagnosis not present

## 2022-10-18 DIAGNOSIS — Z5111 Encounter for antineoplastic chemotherapy: Secondary | ICD-10-CM | POA: Diagnosis not present

## 2022-10-18 DIAGNOSIS — Z79899 Other long term (current) drug therapy: Secondary | ICD-10-CM | POA: Diagnosis not present

## 2022-10-18 DIAGNOSIS — D709 Neutropenia, unspecified: Secondary | ICD-10-CM | POA: Diagnosis not present

## 2022-10-18 DIAGNOSIS — I10 Essential (primary) hypertension: Secondary | ICD-10-CM | POA: Diagnosis not present

## 2022-11-01 DIAGNOSIS — Z79899 Other long term (current) drug therapy: Secondary | ICD-10-CM | POA: Diagnosis not present

## 2022-11-01 DIAGNOSIS — C210 Malignant neoplasm of anus, unspecified: Secondary | ICD-10-CM | POA: Diagnosis not present

## 2022-11-01 DIAGNOSIS — D701 Agranulocytosis secondary to cancer chemotherapy: Secondary | ICD-10-CM | POA: Diagnosis not present

## 2022-11-01 DIAGNOSIS — Z91199 Patient's noncompliance with other medical treatment and regimen due to unspecified reason: Secondary | ICD-10-CM | POA: Diagnosis not present

## 2022-11-01 DIAGNOSIS — I10 Essential (primary) hypertension: Secondary | ICD-10-CM | POA: Diagnosis not present

## 2022-11-01 DIAGNOSIS — B20 Human immunodeficiency virus [HIV] disease: Secondary | ICD-10-CM | POA: Diagnosis not present

## 2022-11-01 DIAGNOSIS — K625 Hemorrhage of anus and rectum: Secondary | ICD-10-CM | POA: Diagnosis not present

## 2022-11-01 DIAGNOSIS — Z79624 Long term (current) use of inhibitors of nucleotide synthesis: Secondary | ICD-10-CM | POA: Diagnosis not present

## 2022-11-01 DIAGNOSIS — C211 Malignant neoplasm of anal canal: Secondary | ICD-10-CM | POA: Diagnosis not present

## 2022-11-04 DIAGNOSIS — C211 Malignant neoplasm of anal canal: Secondary | ICD-10-CM | POA: Diagnosis not present

## 2022-11-04 DIAGNOSIS — Z7689 Persons encountering health services in other specified circumstances: Secondary | ICD-10-CM | POA: Diagnosis not present

## 2022-11-10 ENCOUNTER — Telehealth: Payer: Self-pay

## 2022-11-10 NOTE — Telephone Encounter (Signed)
Detectable Viral Load Intervention   Most recent VL:  HIV 1 RNA Quant  Date Value Ref Range Status  07/27/2022 6,480 (H) Copies/mL Final  07/01/2021 949 (H) Copies/mL Final  12/17/2019 <20 NOT DETECTED NOT DETECT copies/mL Final    Current ART regimen: Symtuza  Appointment status: patient has future appointment scheduled  Called patient to discuss medication adherence and possible barriers to care.   Medication last dispensed (per chart review): 09/06/22   Interventions: Gen reports improved adherence. Denies any missed doses or issues with obtaining medication from the pharmacy. States he is due for a refill soon. Accepts appointment with Dr. Juleen China 3/18. He is still going through chemo treatments.   Beryle Flock, RN

## 2022-11-15 DIAGNOSIS — C20 Malignant neoplasm of rectum: Secondary | ICD-10-CM | POA: Diagnosis not present

## 2022-11-15 DIAGNOSIS — Z21 Asymptomatic human immunodeficiency virus [HIV] infection status: Secondary | ICD-10-CM | POA: Diagnosis not present

## 2022-11-15 DIAGNOSIS — C188 Malignant neoplasm of overlapping sites of colon: Secondary | ICD-10-CM | POA: Diagnosis not present

## 2022-11-15 DIAGNOSIS — C210 Malignant neoplasm of anus, unspecified: Secondary | ICD-10-CM | POA: Diagnosis not present

## 2022-11-15 DIAGNOSIS — I10 Essential (primary) hypertension: Secondary | ICD-10-CM | POA: Diagnosis not present

## 2022-11-15 DIAGNOSIS — Z5111 Encounter for antineoplastic chemotherapy: Secondary | ICD-10-CM | POA: Diagnosis not present

## 2022-11-17 DIAGNOSIS — C20 Malignant neoplasm of rectum: Secondary | ICD-10-CM | POA: Diagnosis not present

## 2022-11-17 DIAGNOSIS — C210 Malignant neoplasm of anus, unspecified: Secondary | ICD-10-CM | POA: Diagnosis not present

## 2022-11-17 DIAGNOSIS — C188 Malignant neoplasm of overlapping sites of colon: Secondary | ICD-10-CM | POA: Diagnosis not present

## 2022-11-22 ENCOUNTER — Encounter: Payer: Self-pay | Admitting: Internal Medicine

## 2022-11-22 ENCOUNTER — Ambulatory Visit (INDEPENDENT_AMBULATORY_CARE_PROVIDER_SITE_OTHER): Payer: Federal, State, Local not specified - PPO | Admitting: Internal Medicine

## 2022-11-22 ENCOUNTER — Other Ambulatory Visit: Payer: Self-pay

## 2022-11-22 VITALS — BP 134/87 | HR 121 | Temp 96.2°F | Ht 71.0 in | Wt 303.0 lb

## 2022-11-22 DIAGNOSIS — B20 Human immunodeficiency virus [HIV] disease: Secondary | ICD-10-CM

## 2022-11-22 DIAGNOSIS — C20 Malignant neoplasm of rectum: Secondary | ICD-10-CM | POA: Diagnosis not present

## 2022-11-22 NOTE — Progress Notes (Signed)
[facility]   CHIEF COMPLAINT    HIV follow up.    SUBJECTIVE:    Alexander Hancock is a 32 y.o. male with PMHx as below who presents to the clinic for HIV follow up.   Please see A&P for the details of today's visit and status of the patient's medical problems.   Patient's Medications  New Prescriptions   No medications on file  Previous Medications   DARUNAVIR-COBICISTAT-EMTRICITABINE-TENOFOVIR ALAFENAMIDE (SYMTUZA) 800-150-200-10 MG TABS    Take 1 tablet by mouth daily with breakfast.   LISINOPRIL (ZESTRIL) 20 MG TABLET    Take 1 tablet by mouth daily.  Modified Medications   No medications on file  Discontinued Medications   No medications on file      Past Medical History:  Diagnosis Date   Allergy    Anal warts    HIV infection ([place])    Hypercholesteremia 06/14/2014   Perirectal ulcer 04/24/2013    Social History   Tobacco Use   Smoking status: Never   Smokeless tobacco: Never  Substance Use Topics   Alcohol use: Yes    Alcohol/week: 1.0 standard drink of alcohol    Types: 1 drink(s) per week   Drug use: No    Family History  Problem Relation Age of Onset   Multiple sclerosis Mother     No Known Allergies  Review of Systems  Constitutional: Negative.   Gastrointestinal: Negative.   Genitourinary: Negative.      OBJECTIVE:    Vitals:   11/22/22 1100  BP: 134/87  Pulse: (!) 121  Temp: (!) 96.2 F (35.7 C)  TempSrc: Temporal  Weight: (!) 303 lb (137.4 kg)  Height: 5\' 11"  (1.803 m)     Body mass index is 42.26 kg/m.  Physical Exam Constitutional:      General: He is not in acute distress.    Appearance: Normal appearance.  HENT:     Head: Normocephalic and atraumatic.  Eyes:     Extraocular Movements: Extraocular movements intact.     Conjunctiva/sclera: Conjunctivae normal.  Pulmonary:     Effort: Pulmonary effort is normal. No respiratory distress.  Skin:    General: Skin is warm and dry.   Neurological:     General: No focal deficit present.     Mental Status: He is alert and oriented to person, place, and time.  Psychiatric:        Mood and Affect: Mood normal.        Behavior: Behavior normal.     Labs and Microbiology:    Latest Ref Rng & Units 07/27/2022    9:21 AM 07/01/2021   11:11 AM 12/17/2019    2:38 PM  CMP  Glucose 65 - 99 mg/dL 114  94  90   BUN 7 - 25 mg/dL 17  18  12    Creatinine 0.60 - 1.26 mg/dL 0.97  1.09  1.01   Sodium 135 - 146 mmol/L 136  139  137   Potassium 3.5 - 5.3 mmol/L 3.8  3.9  4.2   Chloride 98 - 110 mmol/L 104  104  101   CO2 20 - 32 mmol/L 27  27  31    Calcium 8.6 - 10.3 mg/dL 9.3  9.3  9.7   Total Protein 6.1 - 8.1 g/dL 8.8  7.8  7.7   Total Bilirubin 0.2 - 1.2 mg/dL 0.6  0.9  0.8   AST 10 -  40 U/L 17  22  14    ALT 9 - 46 U/L 24  33  18       Latest Ref Rng & Units 07/27/2022    9:21 AM 07/01/2021   11:11 AM 12/17/2019    2:38 PM  CBC  WBC 3.8 - 10.8 Thousand/uL 4.3  4.4  6.2   Hemoglobin 13.2 - 17.1 g/dL 14.5  14.7  14.4   Hematocrit 38.5 - 50.0 % 40.9  43.9  43.6   Platelets 140 - 400 Thousand/uL 215  198  214      Lab Results  Component Value Date   HIV1RNAQUANT 6,480 (H) 07/27/2022   HIV1RNAQUANT 949 (H) 07/01/2021   HIV1RNAQUANT <20 NOT DETECTED 12/17/2019   CD4TABS 483 07/27/2022   CD4TABS 628 07/01/2021   CD4TABS 958 12/17/2019    RPR and STI: Lab Results  Component Value Date   LABRPR REACTIVE (A) 07/27/2022   LABRPR REACTIVE (A) 07/01/2021   LABRPR REACTIVE (A) 06/06/2019   LABRPR REACTIVE (A) 06/10/2015   LABRPR REACTIVE (A) 11/06/2013   RPRTITER 1:2 (H) 07/27/2022   RPRTITER 1:4 (H) 07/01/2021   RPRTITER 1:4 (H) 06/06/2019   RPRTITER 1:4 06/10/2015   RPRTITER 1:16 (A) 11/06/2013    STI Results GC CT  07/27/2022  9:42 AM Negative  Negative   07/01/2021 11:05 AM Negative  Negative   07/01/2021 10:45 AM Negative  Negative   06/06/2019  2:25 PM Negative  Negative     Hepatitis B: Lab  Results  Component Value Date   HEPBSAB POS (A) 12/01/2010   HEPBSAG NEGATIVE 12/01/2010   HEPBCAB NEG 12/01/2010   Hepatitis C: Lab Results  Component Value Date   HEPCAB NON-REACTIVE 08/12/2022   Hepatitis A: Lab Results  Component Value Date   HAV REACTIVE (A) 08/12/2022   Lipids: Lab Results  Component Value Date   CHOL 195 07/27/2022   TRIG 645 (H) 07/27/2022   HDL 30 (L) 07/27/2022   CHOLHDL 6.5 (H) 07/27/2022   VLDL 47 (H) 06/22/2016   LDLCALC  07/27/2022     Comment:     . LDL cholesterol not calculated. Triglyceride levels greater than 400 mg/dL invalidate calculated LDL results. . Reference range: <100 . Desirable range <100 mg/dL for primary prevention;   <70 mg/dL for patients with CHD or diabetic patients  with > or = 2 CHD risk factors. Marland Kitchen LDL-C is now calculated using the Martin-Hopkins  calculation, which is a validated novel method providing  better accuracy than the Friedewald equation in the  estimation of LDL-C.  Cresenciano Genre et al. Annamaria Helling. [id number]): 2061-2068  (http://education.[facility].com/faq/FAQ164)     Imaging:    ASSESSMENT & PLAN:    HIV (human immunodeficiency virus infection) ([place]) Patient here today for close 57-month follow up after re-engagement of care in Dec 2023.  He was scheduled for follow up in January but did not keep that appointment.  He was resumed on Symtuza in December 2023 and reports good adherence with only 1 or 2 missed doses.  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  Check viral load today and follow up again in 2 months.   Rectal cancer [facility]) He is getting treatment right now at [facility] currently on chemotherapy.  He will finish this soon and then have radiation.  He reports treatment is going well thus far.    Orders Placed This Encounter  Procedures   HIV-1 RNA  quant-no reflex-bld      Raynelle [facility] for Infectious  D[facility] 11/22/2022, 11:13 AM

## 2022-11-22 NOTE — Assessment & Plan Note (Signed)
Patient here today for close 70-month follow up after re-engagement of care in Dec 2023.  He was scheduled for follow up in January but did not keep that appointment.  He was resumed on Symtuza in December 2023 and reports good adherence with only 1 or 2 missed doses.  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  Check viral load today and follow up again in 2 months.

## 2022-11-22 NOTE — Assessment & Plan Note (Signed)
He is getting treatment right now at [facility] currently on chemotherapy.  He will finish this soon and then have radiation.  He reports treatment is going well thus far.

## 2022-11-25 LAB — HIV-1 RNA QUANT-NO REFLEX-BLD
HIV 1 RNA Quant: NOT DETECTED Copies/mL
HIV-1 RNA Quant, Log: NOT DETECTED Log cps/mL

## 2022-11-29 ENCOUNTER — Other Ambulatory Visit (HOSPITAL_COMMUNITY): Payer: Self-pay

## 2022-11-29 ENCOUNTER — Telehealth: Payer: Self-pay

## 2022-11-29 DIAGNOSIS — C210 Malignant neoplasm of anus, unspecified: Secondary | ICD-10-CM | POA: Diagnosis not present

## 2022-11-29 DIAGNOSIS — I10 Essential (primary) hypertension: Secondary | ICD-10-CM | POA: Diagnosis not present

## 2022-11-29 DIAGNOSIS — Z21 Asymptomatic human immunodeficiency virus [HIV] infection status: Secondary | ICD-10-CM | POA: Diagnosis not present

## 2022-11-29 DIAGNOSIS — Z5111 Encounter for antineoplastic chemotherapy: Secondary | ICD-10-CM | POA: Diagnosis not present

## 2022-11-29 DIAGNOSIS — T451X5A Adverse effect of antineoplastic and immunosuppressive drugs, initial encounter: Secondary | ICD-10-CM | POA: Diagnosis not present

## 2022-11-29 DIAGNOSIS — D701 Agranulocytosis secondary to cancer chemotherapy: Secondary | ICD-10-CM | POA: Diagnosis not present

## 2022-11-29 DIAGNOSIS — C20 Malignant neoplasm of rectum: Secondary | ICD-10-CM | POA: Diagnosis not present

## 2022-11-29 NOTE — Telephone Encounter (Signed)
Patient called requesting to come by to pick up [place] samples since his pharmacy is out of stock. Advised him clinic does not have Symtuza samples any longer and encouraged him to reach out to his pharmacy and request they order the medication. He has 3 pills left, advised that pharmacy can usually have medication in stock the next day.   Beryle Flock, RN

## 2022-12-06 DIAGNOSIS — I10 Essential (primary) hypertension: Secondary | ICD-10-CM | POA: Diagnosis not present

## 2022-12-06 DIAGNOSIS — Z21 Asymptomatic human immunodeficiency virus [HIV] infection status: Secondary | ICD-10-CM | POA: Diagnosis not present

## 2022-12-06 DIAGNOSIS — K629 Disease of anus and rectum, unspecified: Secondary | ICD-10-CM | POA: Diagnosis not present

## 2022-12-06 DIAGNOSIS — C210 Malignant neoplasm of anus, unspecified: Secondary | ICD-10-CM | POA: Diagnosis not present

## 2022-12-06 DIAGNOSIS — T451X5A Adverse effect of antineoplastic and immunosuppressive drugs, initial encounter: Secondary | ICD-10-CM | POA: Diagnosis not present

## 2022-12-06 DIAGNOSIS — D701 Agranulocytosis secondary to cancer chemotherapy: Secondary | ICD-10-CM | POA: Diagnosis not present

## 2022-12-06 DIAGNOSIS — C218 Malignant neoplasm of overlapping sites of rectum, anus and anal canal: Secondary | ICD-10-CM | POA: Diagnosis not present

## 2022-12-06 DIAGNOSIS — C20 Malignant neoplasm of rectum: Secondary | ICD-10-CM | POA: Diagnosis not present

## 2022-12-06 DIAGNOSIS — Z5111 Encounter for antineoplastic chemotherapy: Secondary | ICD-10-CM | POA: Diagnosis not present

## 2022-12-09 DIAGNOSIS — I10 Essential (primary) hypertension: Secondary | ICD-10-CM | POA: Diagnosis not present

## 2022-12-09 DIAGNOSIS — D701 Agranulocytosis secondary to cancer chemotherapy: Secondary | ICD-10-CM | POA: Diagnosis not present

## 2022-12-09 DIAGNOSIS — K629 Disease of anus and rectum, unspecified: Secondary | ICD-10-CM | POA: Diagnosis not present

## 2022-12-09 DIAGNOSIS — C20 Malignant neoplasm of rectum: Secondary | ICD-10-CM | POA: Diagnosis not present

## 2022-12-09 DIAGNOSIS — T451X5A Adverse effect of antineoplastic and immunosuppressive drugs, initial encounter: Secondary | ICD-10-CM | POA: Diagnosis not present

## 2022-12-09 DIAGNOSIS — C218 Malignant neoplasm of overlapping sites of rectum, anus and anal canal: Secondary | ICD-10-CM | POA: Diagnosis not present

## 2022-12-09 DIAGNOSIS — Z5111 Encounter for antineoplastic chemotherapy: Secondary | ICD-10-CM | POA: Diagnosis not present

## 2022-12-09 DIAGNOSIS — C210 Malignant neoplasm of anus, unspecified: Secondary | ICD-10-CM | POA: Diagnosis not present

## 2022-12-09 DIAGNOSIS — Z21 Asymptomatic human immunodeficiency virus [HIV] infection status: Secondary | ICD-10-CM | POA: Diagnosis not present

## 2022-12-14 DIAGNOSIS — Z9221 Personal history of antineoplastic chemotherapy: Secondary | ICD-10-CM | POA: Diagnosis not present

## 2022-12-14 DIAGNOSIS — C218 Malignant neoplasm of overlapping sites of rectum, anus and anal canal: Secondary | ICD-10-CM | POA: Diagnosis not present

## 2022-12-20 DIAGNOSIS — Z5111 Encounter for antineoplastic chemotherapy: Secondary | ICD-10-CM | POA: Diagnosis not present

## 2022-12-20 DIAGNOSIS — D701 Agranulocytosis secondary to cancer chemotherapy: Secondary | ICD-10-CM | POA: Diagnosis not present

## 2022-12-20 DIAGNOSIS — C218 Malignant neoplasm of overlapping sites of rectum, anus and anal canal: Secondary | ICD-10-CM | POA: Diagnosis not present

## 2022-12-20 DIAGNOSIS — K629 Disease of anus and rectum, unspecified: Secondary | ICD-10-CM | POA: Diagnosis not present

## 2022-12-20 DIAGNOSIS — C20 Malignant neoplasm of rectum: Secondary | ICD-10-CM | POA: Diagnosis not present

## 2022-12-20 DIAGNOSIS — I10 Essential (primary) hypertension: Secondary | ICD-10-CM | POA: Diagnosis not present

## 2022-12-20 DIAGNOSIS — T451X5A Adverse effect of antineoplastic and immunosuppressive drugs, initial encounter: Secondary | ICD-10-CM | POA: Diagnosis not present

## 2022-12-20 DIAGNOSIS — Z21 Asymptomatic human immunodeficiency virus [HIV] infection status: Secondary | ICD-10-CM | POA: Diagnosis not present

## 2022-12-20 DIAGNOSIS — C210 Malignant neoplasm of anus, unspecified: Secondary | ICD-10-CM | POA: Diagnosis not present

## 2022-12-23 DIAGNOSIS — Z21 Asymptomatic human immunodeficiency virus [HIV] infection status: Secondary | ICD-10-CM | POA: Diagnosis not present

## 2022-12-23 DIAGNOSIS — Z5111 Encounter for antineoplastic chemotherapy: Secondary | ICD-10-CM | POA: Diagnosis not present

## 2022-12-23 DIAGNOSIS — T451X5A Adverse effect of antineoplastic and immunosuppressive drugs, initial encounter: Secondary | ICD-10-CM | POA: Diagnosis not present

## 2022-12-23 DIAGNOSIS — C20 Malignant neoplasm of rectum: Secondary | ICD-10-CM | POA: Diagnosis not present

## 2022-12-23 DIAGNOSIS — I10 Essential (primary) hypertension: Secondary | ICD-10-CM | POA: Diagnosis not present

## 2022-12-23 DIAGNOSIS — C218 Malignant neoplasm of overlapping sites of rectum, anus and anal canal: Secondary | ICD-10-CM | POA: Diagnosis not present

## 2022-12-23 DIAGNOSIS — D701 Agranulocytosis secondary to cancer chemotherapy: Secondary | ICD-10-CM | POA: Diagnosis not present

## 2022-12-23 DIAGNOSIS — K629 Disease of anus and rectum, unspecified: Secondary | ICD-10-CM | POA: Diagnosis not present

## 2022-12-23 DIAGNOSIS — C210 Malignant neoplasm of anus, unspecified: Secondary | ICD-10-CM | POA: Diagnosis not present

## 2022-12-30 DIAGNOSIS — C218 Malignant neoplasm of overlapping sites of rectum, anus and anal canal: Secondary | ICD-10-CM | POA: Diagnosis not present

## 2022-12-30 DIAGNOSIS — Z9221 Personal history of antineoplastic chemotherapy: Secondary | ICD-10-CM | POA: Diagnosis not present

## 2023-01-06 ENCOUNTER — Ambulatory Visit: Payer: Federal, State, Local not specified - PPO | Admitting: Internal Medicine

## 2023-01-06 NOTE — Progress Notes (Deleted)
[facility]   CHIEF COMPLAINT    HIV follow up.  ***  SUBJECTIVE:    Alexander Hancock is a 32 y.o. male with PMHx as below who presents to the clinic for HIV follow up.   Please see A&P for the details of today's visit and status of the patient's medical problems.   Patient's Medications  New Prescriptions   No medications on file  Previous Medications   DARUNAVIR-COBICISTAT-EMTRICITABINE-TENOFOVIR ALAFENAMIDE (SYMTUZA) 800-150-200-10 MG TABS    Take 1 tablet by mouth daily with breakfast.   LISINOPRIL (ZESTRIL) 20 MG TABLET    Take 1 tablet by mouth daily.  Modified Medications   No medications on file  Discontinued Medications   No medications on file      Past Medical History:  Diagnosis Date   Allergy    Anal warts    HIV infection (HCC)    Hypercholesteremia 06/14/2014   Perirectal ulcer 04/24/2013    Social History   Tobacco Use   Smoking status: Never   Smokeless tobacco: Never  Substance Use Topics   Alcohol use: Yes    Alcohol/week: 1.0 standard drink of alcohol    Types: 1 drink(s) per week   Drug use: No    Family History  Problem Relation Age of Onset   Multiple sclerosis Mother     No Known Allergies  ROS   OBJECTIVE:    There were no vitals filed for this visit.   There is no height or weight on file to calculate BMI.  Physical Exam  Labs and Microbiology:    Latest Ref Rng & Units 07/27/2022    9:21 AM 07/01/2021   11:11 AM 12/17/2019    2:38 PM  CMP  Glucose 65 - 99 mg/dL 409  94  90   BUN 7 - 25 mg/dL 17  18  12    Creatinine 0.60 - 1.26 mg/dL 8.11  9.14  7.82   Sodium 135 - 146 mmol/L 136  139  137   Potassium 3.5 - 5.3 mmol/L 3.8  3.9  4.2   Chloride 98 - 110 mmol/L 104  104  101   CO2 20 - 32 mmol/L 27  27  31    Calcium 8.6 - 10.3 mg/dL 9.3  9.3  9.7   Total Protein 6.1 - 8.1 g/dL 8.8  7.8  7.7   Total Bilirubin 0.2 - 1.2 mg/dL 0.6  0.9  0.8   AST 10 - 40 U/L 17  22  14    ALT 9 -  46 U/L 24  33  18       Latest Ref Rng & Units 07/27/2022    9:21 AM 07/01/2021   11:11 AM 12/17/2019    2:38 PM  CBC  WBC 3.8 - 10.8 Thousand/uL 4.3  4.4  6.2   Hemoglobin 13.2 - 17.1 g/dL 95.6  21.3  08.6   Hematocrit 38.5 - 50.0 % 40.9  43.9  43.6   Platelets 140 - 400 Thousand/uL 215  198  214      Lab Results  Component Value Date   HIV1RNAQUANT Not Detected 11/22/2022   HIV1RNAQUANT 6,480 (H) 07/27/2022   HIV1RNAQUANT 949 (H) 07/01/2021   CD4TABS 483 07/27/2022   CD4TABS 628 07/01/2021   CD4TABS 958 12/17/2019    RPR and STI: Lab Results  Component Value Date   LABRPR REACTIVE (A) 07/27/2022   LABRPR REACTIVE (A) 07/01/2021  LABRPR REACTIVE (A) 06/06/2019   LABRPR REACTIVE (A) 06/10/2015   LABRPR REACTIVE (A) 11/06/2013   RPRTITER 1:2 (H) 07/27/2022   RPRTITER 1:4 (H) 07/01/2021   RPRTITER 1:4 (H) 06/06/2019   RPRTITER 1:4 06/10/2015   RPRTITER 1:16 (A) 11/06/2013    STI Results GC CT  07/27/2022  9:42 AM Negative  Negative   07/01/2021 11:05 AM Negative  Negative   07/01/2021 10:45 AM Negative  Negative   06/06/2019  2:25 PM Negative  Negative     Hepatitis B: Lab Results  Component Value Date   HEPBSAB POS (A) 12/01/2010   HEPBSAG NEGATIVE 12/01/2010   HEPBCAB NEG 12/01/2010   Hepatitis C: Lab Results  Component Value Date   HEPCAB NON-REACTIVE 08/12/2022   Hepatitis A: Lab Results  Component Value Date   HAV REACTIVE (A) 08/12/2022   Lipids: Lab Results  Component Value Date   CHOL 195 07/27/2022   TRIG 645 (H) 07/27/2022   HDL 30 (L) 07/27/2022   CHOLHDL 6.5 (H) 07/27/2022   VLDL 47 (H) 06/22/2016   LDLCALC  07/27/2022     Comment:     . LDL cholesterol not calculated. Triglyceride levels greater than 400 mg/dL invalidate calculated LDL results. . Reference range: <100 . Desirable range <100 mg/dL for primary prevention;   <70 mg/dL for patients with CHD or diabetic patients  with > or = 2 CHD risk factors. Marland Kitchen LDL-C is now  calculated using the Martin-Hopkins  calculation, which is a validated novel method providing  better accuracy than the Friedewald equation in the  estimation of LDL-C.  Horald Pollen et al. Lenox Ahr. 1610;960(45): 2061-2068  (http://education.[facility].com/faq/FAQ164)     Imaging:    ASSESSMENT & PLAN:    No problem-specific Assessment & Plan notes found for this encounter.   No orders of the defined types were placed in this encounter.      *** Vaccines Influenza: give every year COVID: recommend vaccination if not already done Prevnar 20: Give x 1 if no prior pneumonia vaccine.    - If only 1 dose of either PPSV 23 OR PCV 13 ----> give PCV 20 if > 1 year since last vaccine  - If received both PPSV 23 AND PCV 13 ----> give PCV 20 if > 5 years since last vaccine.  If < 5 years then wait to give PCV 20 Pneumovax-23: (if CD4 >200) give twice every 5 years apart before age 39, then once at age 32.  Give >8 weeks from [place] Prevnar-13: (preferably when CD4 >200) give once, give >1 year from last Pneumovax-23 Hepatitis A: give Havrix 2 dose series at 0 and 6-12 months if non-immune Hepatitis B: give Heplisav 2 dose series at 0 and 4 weeks if non-immune.  Repeat serology 2 months after vaccine and revaccinate if needed MenACWY: 2 dose primary series 8 weeks apart, then 1 dose booster every 5 years HPV: Gardasil-9 at 0, 2, and 6 months for ages 9-26 should be vaccinated.  Ages 60-45 should be offered if appropriate Tdap: give every 10 years Shingles: give Shingrix 2 dose series at 0 and 2-6 months if >50 years on ART with CD4 cell count >200 Varicella: primary vaccination may be considered in VZV seronegative persons aged >8 years (if CD4 >200)  MMR: vaccine should be given if born in [birth year] or after and do not have immunity (if CD4 >200)  Screening DEXA Scan: if age >66 Quantiferon: check at initiation of care Hepatitis C: check at initiation  of care.  Screen annually if risk  factors HLA B5701: check at initiation of care G6PD: check if starting therapy with oxidant drugs Lipids: check annually Urinalysis: check annually or every 6 months if on tenofovir Hgb A1c: check annually  ASCVD Risk Score Consider high-intensity statin therapy if 10-year ASCVD risk score >7.5% The ASCVD Risk score (Arnett DK, et al., 2019) failed to calculate for the following reasons:   The 2019 ASCVD risk score is only valid for ages 31 to 17   Lady Deutscher [facility] 01/06/2023, 11:38 AM  HIV: Patient presents today for routine HIV follow-up.  He is maintained on Symtuza with good adherence after reengaging into care in December 2023.  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  His follow-up labs in March 2024 after getting back on treatment showed that his viral load was now undetectable.  His CD4 count in November 2023 was 483.  Will continue Symtuza at this time, check viral load, and follow-up again in 3 months.  Rectal cancer:  Patient continues to undergo treatment at [facility].   Vaccines: Prevnar 20 vaccine recommended and patient ***.  STI/Screening: Screening offered.  Patient ***.  Encounter for medication monitoring: ***

## 2023-01-12 ENCOUNTER — Other Ambulatory Visit: Payer: Self-pay

## 2023-01-12 DIAGNOSIS — B20 Human immunodeficiency virus [HIV] disease: Secondary | ICD-10-CM

## 2023-01-12 MED ORDER — SYMTUZA 800-150-200-10 MG PO TABS: 1.0000 | ORAL_TABLET | Freq: Every day | ORAL | 0 refills | Status: AC

## 2023-01-12 NOTE — Progress Notes (Signed)
Patient called, he mistakenly left his medication in [place] and is now back in [place]. Requesting refills. Per chart review, medication was last dispensed 01/04/23 and insurance is unlikely to allow another refill this early.   Refill sent at patient's request, asked [place] to let us know if he is unable to fill his medication. Unfortunately, we no longer have samples of Symtuza.   Sandie Ano, RN

## 2023-01-13 ENCOUNTER — Telehealth: Payer: Self-pay

## 2023-01-13 DIAGNOSIS — C218 Malignant neoplasm of overlapping sites of rectum, anus and anal canal: Secondary | ICD-10-CM | POA: Diagnosis not present

## 2023-01-13 DIAGNOSIS — Z79630 Long term (current) use of alkylating agent: Secondary | ICD-10-CM | POA: Diagnosis not present

## 2023-01-13 DIAGNOSIS — Z79899 Other long term (current) drug therapy: Secondary | ICD-10-CM | POA: Diagnosis not present

## 2023-01-13 DIAGNOSIS — Z510 Encounter for antineoplastic radiation therapy: Secondary | ICD-10-CM | POA: Diagnosis not present

## 2023-01-13 DIAGNOSIS — C20 Malignant neoplasm of rectum: Secondary | ICD-10-CM | POA: Diagnosis not present

## 2023-01-13 DIAGNOSIS — Z79631 Long term (current) use of antimetabolite agent: Secondary | ICD-10-CM | POA: Diagnosis not present

## 2023-01-13 NOTE — Telephone Encounter (Signed)
Patient left Symtuza in [place] and it is too early to fill. No samples of Symtuza available. Spoke with [facility] who will call patient's insurance to try and submit a claim to override and fill medication early.   Sandie Ano, RN

## 2023-01-13 NOTE — Telephone Encounter (Signed)
Spoke with [facility], patient's Symtuza is ready for pickup for $0. Notified patient. Asked him to please call if he has any difficulty getting his prescription.   Sandie Ano, RN

## 2023-01-17 ENCOUNTER — Ambulatory Visit: Payer: Federal, State, Local not specified - PPO | Admitting: Internal Medicine

## 2023-01-17 NOTE — Progress Notes (Deleted)
[facility]   CHIEF COMPLAINT    HIV follow up.  ***  SUBJECTIVE:    Alexander Hancock is a 32 y.o. male with PMHx as below who presents to the clinic for HIV follow up.   Please see A&P for the details of today's visit and status of the patient's medical problems.   Patient's Medications  New Prescriptions   No medications on file  Previous Medications   DARUNAVIR-COBICISTAT-EMTRICITABINE-TENOFOVIR ALAFENAMIDE (SYMTUZA) 800-150-200-10 MG TABS    Take 1 tablet by mouth daily with breakfast.   LISINOPRIL (ZESTRIL) 20 MG TABLET    Take 1 tablet by mouth daily.  Modified Medications   No medications on file  Discontinued Medications   No medications on file      Past Medical History:  Diagnosis Date   Allergy    Anal warts    HIV infection (HCC)    Hypercholesteremia 06/14/2014   Perirectal ulcer 04/24/2013    Social History   Tobacco Use   Smoking status: Never   Smokeless tobacco: Never  Substance Use Topics   Alcohol use: Yes    Alcohol/week: 1.0 standard drink of alcohol    Types: 1 drink(s) per week   Drug use: No    Family History  Problem Relation Age of Onset   Multiple sclerosis Mother     No Known Allergies  ROS   OBJECTIVE:    There were no vitals filed for this visit.   There is no height or weight on file to calculate BMI.  Physical Exam  Labs and Microbiology:    Latest Ref Rng & Units 07/27/2022    9:21 AM 07/01/2021   11:11 AM 12/17/2019    2:38 PM  CMP  Glucose 65 - 99 mg/dL 409  94  90   BUN 7 - 25 mg/dL 17  18  12    Creatinine 0.60 - 1.26 mg/dL 8.11  9.14  7.82   Sodium 135 - 146 mmol/L 136  139  137   Potassium 3.5 - 5.3 mmol/L 3.8  3.9  4.2   Chloride 98 - 110 mmol/L 104  104  101   CO2 20 - 32 mmol/L 27  27  31    Calcium 8.6 - 10.3 mg/dL 9.3  9.3  9.7   Total Protein 6.1 - 8.1 g/dL 8.8  7.8  7.7   Total Bilirubin 0.2 - 1.2 mg/dL 0.6  0.9  0.8   AST 10 - 40 U/L 17  22  14    ALT 9 -  46 U/L 24  33  18       Latest Ref Rng & Units 07/27/2022    9:21 AM 07/01/2021   11:11 AM 12/17/2019    2:38 PM  CBC  WBC 3.8 - 10.8 Thousand/uL 4.3  4.4  6.2   Hemoglobin 13.2 - 17.1 g/dL 95.6  21.3  08.6   Hematocrit 38.5 - 50.0 % 40.9  43.9  43.6   Platelets 140 - 400 Thousand/uL 215  198  214      Lab Results  Component Value Date   HIV1RNAQUANT Not Detected 11/22/2022   HIV1RNAQUANT 6,480 (H) 07/27/2022   HIV1RNAQUANT 949 (H) 07/01/2021   CD4TABS 483 07/27/2022   CD4TABS 628 07/01/2021   CD4TABS 958 12/17/2019    RPR and STI: Lab Results  Component Value Date   LABRPR REACTIVE (A) 07/27/2022   LABRPR REACTIVE (A) 07/01/2021  LABRPR REACTIVE (A) 06/06/2019   LABRPR REACTIVE (A) 06/10/2015   LABRPR REACTIVE (A) 11/06/2013   RPRTITER 1:2 (H) 07/27/2022   RPRTITER 1:4 (H) 07/01/2021   RPRTITER 1:4 (H) 06/06/2019   RPRTITER 1:4 06/10/2015   RPRTITER 1:16 (A) 11/06/2013    STI Results GC CT  07/27/2022  9:42 AM Negative  Negative   07/01/2021 11:05 AM Negative  Negative   07/01/2021 10:45 AM Negative  Negative   06/06/2019  2:25 PM Negative  Negative     Hepatitis B: Lab Results  Component Value Date   HEPBSAB POS (A) 12/01/2010   HEPBSAG NEGATIVE 12/01/2010   HEPBCAB NEG 12/01/2010   Hepatitis C: Lab Results  Component Value Date   HEPCAB NON-REACTIVE 08/12/2022   Hepatitis A: Lab Results  Component Value Date   HAV REACTIVE (A) 08/12/2022   Lipids: Lab Results  Component Value Date   CHOL 195 07/27/2022   TRIG 645 (H) 07/27/2022   HDL 30 (L) 07/27/2022   CHOLHDL 6.5 (H) 07/27/2022   VLDL 47 (H) 06/22/2016   LDLCALC  07/27/2022     Comment:     . LDL cholesterol not calculated. Triglyceride levels greater than 400 mg/dL invalidate calculated LDL results. . Reference range: <100 . Desirable range <100 mg/dL for primary prevention;   <70 mg/dL for patients with CHD or diabetic patients  with > or = 2 CHD risk factors. Marland Kitchen LDL-C is now  calculated using the Martin-Hopkins  calculation, which is a validated novel method providing  better accuracy than the Friedewald equation in the  estimation of LDL-C.  Horald Pollen et al. Lenox Ahr. 1610;960(45): 2061-2068  (http://education.[facility].com/faq/FAQ164)     Imaging:    ASSESSMENT & PLAN:    No problem-specific Assessment & Plan notes found for this encounter.   No orders of the defined types were placed in this encounter.      *** Vaccines Influenza: give every year COVID: recommend vaccination if not already done Prevnar 20: Give x 1 if no prior pneumonia vaccine.    - If only 1 dose of either PPSV 23 OR PCV 13 ----> give PCV 20 if > 1 year since last vaccine  - If received both PPSV 23 AND PCV 13 ----> give PCV 20 if > 5 years since last vaccine.  If < 5 years then wait to give PCV 20 Pneumovax-23: (if CD4 >200) give twice every 5 years apart before age 39, then once at age 32.  Give >8 weeks from [place] Prevnar-13: (preferably when CD4 >200) give once, give >1 year from last Pneumovax-23 Hepatitis A: give Havrix 2 dose series at 0 and 6-12 months if non-immune Hepatitis B: give Heplisav 2 dose series at 0 and 4 weeks if non-immune.  Repeat serology 2 months after vaccine and revaccinate if needed MenACWY: 2 dose primary series 8 weeks apart, then 1 dose booster every 5 years HPV: Gardasil-9 at 0, 2, and 6 months for ages 9-26 should be vaccinated.  Ages 60-45 should be offered if appropriate Tdap: give every 10 years Shingles: give Shingrix 2 dose series at 0 and 2-6 months if >50 years on ART with CD4 cell count >200 Varicella: primary vaccination may be considered in VZV seronegative persons aged >8 years (if CD4 >200)  MMR: vaccine should be given if born in [birth year] or after and do not have immunity (if CD4 >200)  Screening DEXA Scan: if age >66 Quantiferon: check at initiation of care Hepatitis C: check at initiation  of care.  Screen annually if risk  factors HLA B5701: check at initiation of care G6PD: check if starting therapy with oxidant drugs Lipids: check annually Urinalysis: check annually or every 6 months if on tenofovir Hgb A1c: check annually  ASCVD Risk Score Consider high-intensity statin therapy if 10-year ASCVD risk score >7.5% The ASCVD Risk score (Arnett DK, et al., 2019) failed to calculate for the following reasons:   The 2019 ASCVD risk score is only valid for ages 48 to 1   Lady Deutscher [facility] 01/17/2023, 5:04 AM  HIV: Patient presents today for routine HIV follow-up.  He is maintained on Symtuza with good adherence after reengaging into care in December 2023.  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  His follow-up labs in March 2024 after getting back on treatment showed that his viral load was now undetectable.  His CD4 count in November 2023 was 483.  Will continue Symtuza at this time, check viral load and CD4 count, and follow-up again in 6 months.  Rectal cancer:  Patient continues to undergo treatment at [facility].   Vaccines: Prevnar 20 vaccine recommended and patient ***.  STI/Screening: Screening offered.  Patient ***.  Encounter for medication monitoring: ***

## 2023-01-19 DIAGNOSIS — Z79630 Long term (current) use of alkylating agent: Secondary | ICD-10-CM | POA: Diagnosis not present

## 2023-01-19 DIAGNOSIS — Z510 Encounter for antineoplastic radiation therapy: Secondary | ICD-10-CM | POA: Diagnosis not present

## 2023-01-19 DIAGNOSIS — C20 Malignant neoplasm of rectum: Secondary | ICD-10-CM | POA: Diagnosis not present

## 2023-01-19 DIAGNOSIS — Z79899 Other long term (current) drug therapy: Secondary | ICD-10-CM | POA: Diagnosis not present

## 2023-01-19 DIAGNOSIS — C218 Malignant neoplasm of overlapping sites of rectum, anus and anal canal: Secondary | ICD-10-CM | POA: Diagnosis not present

## 2023-01-19 DIAGNOSIS — Z79631 Long term (current) use of antimetabolite agent: Secondary | ICD-10-CM | POA: Diagnosis not present

## 2023-01-20 DIAGNOSIS — Z510 Encounter for antineoplastic radiation therapy: Secondary | ICD-10-CM | POA: Diagnosis not present

## 2023-01-20 DIAGNOSIS — Z79899 Other long term (current) drug therapy: Secondary | ICD-10-CM | POA: Diagnosis not present

## 2023-01-20 DIAGNOSIS — C218 Malignant neoplasm of overlapping sites of rectum, anus and anal canal: Secondary | ICD-10-CM | POA: Diagnosis not present

## 2023-01-20 DIAGNOSIS — C20 Malignant neoplasm of rectum: Secondary | ICD-10-CM | POA: Diagnosis not present

## 2023-01-20 DIAGNOSIS — Z79631 Long term (current) use of antimetabolite agent: Secondary | ICD-10-CM | POA: Diagnosis not present

## 2023-01-20 DIAGNOSIS — Z79630 Long term (current) use of alkylating agent: Secondary | ICD-10-CM | POA: Diagnosis not present

## 2023-01-21 DIAGNOSIS — Z79631 Long term (current) use of antimetabolite agent: Secondary | ICD-10-CM | POA: Diagnosis not present

## 2023-01-21 DIAGNOSIS — C218 Malignant neoplasm of overlapping sites of rectum, anus and anal canal: Secondary | ICD-10-CM | POA: Diagnosis not present

## 2023-01-21 DIAGNOSIS — Z510 Encounter for antineoplastic radiation therapy: Secondary | ICD-10-CM | POA: Diagnosis not present

## 2023-01-21 DIAGNOSIS — C20 Malignant neoplasm of rectum: Secondary | ICD-10-CM | POA: Diagnosis not present

## 2023-01-21 DIAGNOSIS — Z79630 Long term (current) use of alkylating agent: Secondary | ICD-10-CM | POA: Diagnosis not present

## 2023-01-21 DIAGNOSIS — Z79899 Other long term (current) drug therapy: Secondary | ICD-10-CM | POA: Diagnosis not present

## 2023-01-24 DIAGNOSIS — C20 Malignant neoplasm of rectum: Secondary | ICD-10-CM | POA: Diagnosis not present

## 2023-01-24 DIAGNOSIS — C218 Malignant neoplasm of overlapping sites of rectum, anus and anal canal: Secondary | ICD-10-CM | POA: Diagnosis not present

## 2023-01-24 DIAGNOSIS — Z510 Encounter for antineoplastic radiation therapy: Secondary | ICD-10-CM | POA: Diagnosis not present

## 2023-01-24 DIAGNOSIS — Z79630 Long term (current) use of alkylating agent: Secondary | ICD-10-CM | POA: Diagnosis not present

## 2023-01-24 DIAGNOSIS — Z79899 Other long term (current) drug therapy: Secondary | ICD-10-CM | POA: Diagnosis not present

## 2023-01-24 DIAGNOSIS — Z79631 Long term (current) use of antimetabolite agent: Secondary | ICD-10-CM | POA: Diagnosis not present

## 2023-01-25 DIAGNOSIS — C218 Malignant neoplasm of overlapping sites of rectum, anus and anal canal: Secondary | ICD-10-CM | POA: Diagnosis not present

## 2023-01-25 DIAGNOSIS — Z79631 Long term (current) use of antimetabolite agent: Secondary | ICD-10-CM | POA: Diagnosis not present

## 2023-01-25 DIAGNOSIS — Z510 Encounter for antineoplastic radiation therapy: Secondary | ICD-10-CM | POA: Diagnosis not present

## 2023-01-25 DIAGNOSIS — C20 Malignant neoplasm of rectum: Secondary | ICD-10-CM | POA: Diagnosis not present

## 2023-01-25 DIAGNOSIS — Z79630 Long term (current) use of alkylating agent: Secondary | ICD-10-CM | POA: Diagnosis not present

## 2023-01-25 DIAGNOSIS — Z79899 Other long term (current) drug therapy: Secondary | ICD-10-CM | POA: Diagnosis not present

## 2023-01-26 DIAGNOSIS — Z510 Encounter for antineoplastic radiation therapy: Secondary | ICD-10-CM | POA: Diagnosis not present

## 2023-01-26 DIAGNOSIS — Z79630 Long term (current) use of alkylating agent: Secondary | ICD-10-CM | POA: Diagnosis not present

## 2023-01-26 DIAGNOSIS — C218 Malignant neoplasm of overlapping sites of rectum, anus and anal canal: Secondary | ICD-10-CM | POA: Diagnosis not present

## 2023-01-26 DIAGNOSIS — Z79631 Long term (current) use of antimetabolite agent: Secondary | ICD-10-CM | POA: Diagnosis not present

## 2023-01-26 DIAGNOSIS — C20 Malignant neoplasm of rectum: Secondary | ICD-10-CM | POA: Diagnosis not present

## 2023-01-26 DIAGNOSIS — Z79899 Other long term (current) drug therapy: Secondary | ICD-10-CM | POA: Diagnosis not present

## 2023-01-27 DIAGNOSIS — Z79630 Long term (current) use of alkylating agent: Secondary | ICD-10-CM | POA: Diagnosis not present

## 2023-01-27 DIAGNOSIS — Z510 Encounter for antineoplastic radiation therapy: Secondary | ICD-10-CM | POA: Diagnosis not present

## 2023-01-27 DIAGNOSIS — C218 Malignant neoplasm of overlapping sites of rectum, anus and anal canal: Secondary | ICD-10-CM | POA: Diagnosis not present

## 2023-01-27 DIAGNOSIS — Z79631 Long term (current) use of antimetabolite agent: Secondary | ICD-10-CM | POA: Diagnosis not present

## 2023-01-27 DIAGNOSIS — C20 Malignant neoplasm of rectum: Secondary | ICD-10-CM | POA: Diagnosis not present

## 2023-01-27 DIAGNOSIS — Z79899 Other long term (current) drug therapy: Secondary | ICD-10-CM | POA: Diagnosis not present

## 2023-02-01 DIAGNOSIS — C218 Malignant neoplasm of overlapping sites of rectum, anus and anal canal: Secondary | ICD-10-CM | POA: Diagnosis not present

## 2023-02-01 DIAGNOSIS — Z79631 Long term (current) use of antimetabolite agent: Secondary | ICD-10-CM | POA: Diagnosis not present

## 2023-02-01 DIAGNOSIS — Z79899 Other long term (current) drug therapy: Secondary | ICD-10-CM | POA: Diagnosis not present

## 2023-02-01 DIAGNOSIS — Z510 Encounter for antineoplastic radiation therapy: Secondary | ICD-10-CM | POA: Diagnosis not present

## 2023-02-01 DIAGNOSIS — Z79630 Long term (current) use of alkylating agent: Secondary | ICD-10-CM | POA: Diagnosis not present

## 2023-02-01 DIAGNOSIS — C20 Malignant neoplasm of rectum: Secondary | ICD-10-CM | POA: Diagnosis not present

## 2023-02-07 DIAGNOSIS — Z510 Encounter for antineoplastic radiation therapy: Secondary | ICD-10-CM | POA: Diagnosis not present

## 2023-02-07 DIAGNOSIS — C218 Malignant neoplasm of overlapping sites of rectum, anus and anal canal: Secondary | ICD-10-CM | POA: Diagnosis not present

## 2023-02-07 DIAGNOSIS — C20 Malignant neoplasm of rectum: Secondary | ICD-10-CM | POA: Diagnosis not present

## 2023-02-08 DIAGNOSIS — Z510 Encounter for antineoplastic radiation therapy: Secondary | ICD-10-CM | POA: Diagnosis not present

## 2023-02-08 DIAGNOSIS — C218 Malignant neoplasm of overlapping sites of rectum, anus and anal canal: Secondary | ICD-10-CM | POA: Diagnosis not present

## 2023-02-08 DIAGNOSIS — C20 Malignant neoplasm of rectum: Secondary | ICD-10-CM | POA: Diagnosis not present

## 2023-02-09 DIAGNOSIS — Z510 Encounter for antineoplastic radiation therapy: Secondary | ICD-10-CM | POA: Diagnosis not present

## 2023-02-09 DIAGNOSIS — C218 Malignant neoplasm of overlapping sites of rectum, anus and anal canal: Secondary | ICD-10-CM | POA: Diagnosis not present

## 2023-02-09 DIAGNOSIS — C20 Malignant neoplasm of rectum: Secondary | ICD-10-CM | POA: Diagnosis not present

## 2023-02-10 DIAGNOSIS — C20 Malignant neoplasm of rectum: Secondary | ICD-10-CM | POA: Diagnosis not present

## 2023-02-10 DIAGNOSIS — C218 Malignant neoplasm of overlapping sites of rectum, anus and anal canal: Secondary | ICD-10-CM | POA: Diagnosis not present

## 2023-02-10 DIAGNOSIS — Z510 Encounter for antineoplastic radiation therapy: Secondary | ICD-10-CM | POA: Diagnosis not present

## 2023-02-11 DIAGNOSIS — C20 Malignant neoplasm of rectum: Secondary | ICD-10-CM | POA: Diagnosis not present

## 2023-02-11 DIAGNOSIS — C218 Malignant neoplasm of overlapping sites of rectum, anus and anal canal: Secondary | ICD-10-CM | POA: Diagnosis not present

## 2023-02-11 DIAGNOSIS — Z510 Encounter for antineoplastic radiation therapy: Secondary | ICD-10-CM | POA: Diagnosis not present

## 2023-02-14 DIAGNOSIS — C20 Malignant neoplasm of rectum: Secondary | ICD-10-CM | POA: Diagnosis not present

## 2023-02-14 DIAGNOSIS — C218 Malignant neoplasm of overlapping sites of rectum, anus and anal canal: Secondary | ICD-10-CM | POA: Diagnosis not present

## 2023-02-14 DIAGNOSIS — Z510 Encounter for antineoplastic radiation therapy: Secondary | ICD-10-CM | POA: Diagnosis not present

## 2023-02-15 DIAGNOSIS — C218 Malignant neoplasm of overlapping sites of rectum, anus and anal canal: Secondary | ICD-10-CM | POA: Diagnosis not present

## 2023-02-15 DIAGNOSIS — C20 Malignant neoplasm of rectum: Secondary | ICD-10-CM | POA: Diagnosis not present

## 2023-02-15 DIAGNOSIS — Z510 Encounter for antineoplastic radiation therapy: Secondary | ICD-10-CM | POA: Diagnosis not present

## 2023-02-16 DIAGNOSIS — Z510 Encounter for antineoplastic radiation therapy: Secondary | ICD-10-CM | POA: Diagnosis not present

## 2023-02-16 DIAGNOSIS — C20 Malignant neoplasm of rectum: Secondary | ICD-10-CM | POA: Diagnosis not present

## 2023-02-16 DIAGNOSIS — C218 Malignant neoplasm of overlapping sites of rectum, anus and anal canal: Secondary | ICD-10-CM | POA: Diagnosis not present

## 2023-02-17 DIAGNOSIS — Z510 Encounter for antineoplastic radiation therapy: Secondary | ICD-10-CM | POA: Diagnosis not present

## 2023-02-17 DIAGNOSIS — C218 Malignant neoplasm of overlapping sites of rectum, anus and anal canal: Secondary | ICD-10-CM | POA: Diagnosis not present

## 2023-02-17 DIAGNOSIS — C20 Malignant neoplasm of rectum: Secondary | ICD-10-CM | POA: Diagnosis not present

## 2023-05-24 ENCOUNTER — Telehealth: Payer: Self-pay

## 2023-05-24 NOTE — Telephone Encounter (Signed)
Called patient after receiving refill request for Symtuza. Patient is past due for follow up visit. Will need to schedule appointment before refills can be sent to pharmacy.  Voicemail is full and not accepting messages. Alexander Hancock, RMA

## 2023-10-18 DIAGNOSIS — C210 Malignant neoplasm of anus, unspecified: Secondary | ICD-10-CM | POA: Diagnosis not present

## 2023-10-18 DIAGNOSIS — C218 Malignant neoplasm of overlapping sites of rectum, anus and anal canal: Secondary | ICD-10-CM | POA: Diagnosis not present

## 2023-11-03 DIAGNOSIS — C210 Malignant neoplasm of anus, unspecified: Secondary | ICD-10-CM | POA: Diagnosis not present

## 2023-11-04 DIAGNOSIS — K760 Fatty (change of) liver, not elsewhere classified: Secondary | ICD-10-CM | POA: Diagnosis not present

## 2023-11-04 DIAGNOSIS — C210 Malignant neoplasm of anus, unspecified: Secondary | ICD-10-CM | POA: Diagnosis not present

## 2023-11-04 DIAGNOSIS — M7989 Other specified soft tissue disorders: Secondary | ICD-10-CM | POA: Diagnosis not present

## 2023-11-04 DIAGNOSIS — R911 Solitary pulmonary nodule: Secondary | ICD-10-CM | POA: Diagnosis not present

## 2023-12-28 DIAGNOSIS — C210 Malignant neoplasm of anus, unspecified: Secondary | ICD-10-CM | POA: Diagnosis not present

## 2024-04-25 ENCOUNTER — Telehealth: Payer: Self-pay

## 2024-04-25 NOTE — Telephone Encounter (Signed)
 [facility] notified.  Jaydalyn Demattia, BSN, RN

## 2024-04-25 NOTE — Telephone Encounter (Signed)
 Heron of the [facility] Dept. called to inform the pt has moved to [place] . Heron can be reached at [phone], if needed.
